# Patient Record
Sex: Female | Born: 1995 | Race: Black or African American | Hispanic: Yes | Marital: Single | Smoking: Current every day smoker
Health system: Southern US, Community
[De-identification: ages and names within clinical notes are randomized; demographics above are authoritative.]

## PROBLEM LIST (undated history)

## (undated) DIAGNOSIS — F419 Anxiety disorder, unspecified: Secondary | ICD-10-CM

## (undated) DIAGNOSIS — F32A Depression, unspecified: Secondary | ICD-10-CM

## (undated) HISTORY — DX: Anxiety disorder, unspecified: F41.9

## (undated) HISTORY — DX: Depression, unspecified: F32.A

## (undated) HISTORY — PX: APPENDECTOMY: SHX54

---
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
## 2015-01-04 ENCOUNTER — Emergency Department (HOSPITAL_COMMUNITY)
Admission: EM | Admit: 2015-01-04 | Discharge: 2015-01-04 | Disposition: A | Discharge status: Home / Self Care | Payer: No Typology Code available for payment source | Attending: Emergency Medicine | Admitting: Emergency Medicine

## 2015-01-04 ENCOUNTER — Encounter (HOSPITAL_COMMUNITY): Payer: Self-pay | Admitting: *Deleted

## 2015-01-04 DIAGNOSIS — Z8619 Personal history of other infectious and parasitic diseases: Secondary | ICD-10-CM | POA: Insufficient documentation

## 2015-01-04 DIAGNOSIS — N898 Other specified noninflammatory disorders of vagina: Secondary | ICD-10-CM | POA: Insufficient documentation

## 2015-01-04 DIAGNOSIS — Z792 Long term (current) use of antibiotics: Secondary | ICD-10-CM | POA: Diagnosis not present

## 2015-01-04 DIAGNOSIS — Z711 Person with feared health complaint in whom no diagnosis is made: Secondary | ICD-10-CM

## 2015-01-04 DIAGNOSIS — Z113 Encounter for screening for infections with a predominantly sexual mode of transmission: Secondary | ICD-10-CM | POA: Insufficient documentation

## 2015-01-04 LAB — POC URINE PREG, ED: Preg Test, Ur: NEGATIVE

## 2015-01-04 LAB — WET PREP, GENITAL
CLUE CELLS WET PREP: NONE SEEN
Trich, Wet Prep: NONE SEEN
WBC, Wet Prep HPF POC: NONE SEEN
YEAST WET PREP: NONE SEEN

## 2015-01-04 LAB — URINALYSIS, ROUTINE W REFLEX MICROSCOPIC
Bilirubin Urine: NEGATIVE
Glucose, UA: NEGATIVE mg/dL
Hgb urine dipstick: NEGATIVE
Ketones, ur: NEGATIVE mg/dL
LEUKOCYTES UA: NEGATIVE
NITRITE: NEGATIVE
PH: 6.5 (ref 5.0–8.0)
PROTEIN: NEGATIVE mg/dL
Specific Gravity, Urine: 1.007 (ref 1.005–1.030)
UROBILINOGEN UA: 0.2 mg/dL (ref 0.0–1.0)

## 2015-01-04 MED ORDER — CEFTRIAXONE SODIUM 250 MG IJ SOLR
250.0000 mg | Freq: Once | INTRAMUSCULAR | Status: AC
  Administered 2015-01-04: 250 mg via INTRAMUSCULAR
  Filled 2015-01-04: qty 250

## 2015-01-04 MED ORDER — LIDOCAINE HCL (PF) 1 % IJ SOLN
2.0000 mL | Freq: Once | INTRAMUSCULAR | Status: AC
  Administered 2015-01-04: 2 mL via INTRADERMAL
  Filled 2015-01-04: qty 5

## 2015-01-04 MED ORDER — LIDOCAINE HCL 2 % IJ SOLN: 5.0000 mL | Freq: Once | INTRAMUSCULAR | Status: DC

## 2015-01-04 MED ORDER — AZITHROMYCIN 250 MG PO TABS
1000.0000 mg | ORAL_TABLET | Freq: Once | ORAL | Status: AC
  Administered 2015-01-04: 1000 mg via ORAL
  Filled 2015-01-04: qty 4

## 2015-01-04 NOTE — Discharge Instructions (Signed)

## 2015-01-04 NOTE — ED Notes (Signed)
Pt states that she has had "uncomfortable urination" and vaginal discharge with a foul odor. Pt states that she has some lower abdominal pain as well.

## 2015-01-04 NOTE — ED Provider Notes (Signed)
CSN: [id number]     Arrival date & time 01/04/15  1310 History   First MD Initiated Contact with Patient 01/04/15 1520     Chief Complaint  Patient presents with  . Vaginal Discharge  . Dysuria   Danielle Arroyo is a 19 y.o. female with a history of an appendectomy who presents to the ED complaining of urine odor, urinary urgency, urinary frequency, white vaginal discharge for the past 4 days. She denies any abdominal pain. Patient is sexually active and not using protection. The patient does have a history of chlamydia. Patient reports taking peridium with minimal relief. Her last menstrual cycle was on 12/16/2014 was normal. Patient's only abdominal surgical history is an appendectomy. The patient denies fevers, chills, nausea, vomiting, diarrhea, abdominal pain, rashes, or hematochezia.   (Consider location/radiation/quality/duration/timing/severity/associated sxs/prior Treatment) HPI  History reviewed. No pertinent past medical history. Past Surgical History  Procedure Laterality Date  . Appendectomy     No family history on file. History  Substance Use Topics  . Smoking status: Never Smoker   . Smokeless tobacco: Not on file  . Alcohol Use: No   OB History    No data available     Review of Systems  Constitutional: Negative for fever and chills.  HENT: Negative for congestion and sore throat.   Eyes: Negative for visual disturbance.  Respiratory: Negative for cough, shortness of breath and wheezing.   Cardiovascular: Negative for chest pain and palpitations.  Gastrointestinal: Negative for nausea, vomiting, abdominal pain, diarrhea and blood in stool.  Genitourinary: Positive for dysuria, urgency, frequency and vaginal discharge. Negative for hematuria, flank pain, vaginal bleeding, difficulty urinating, genital sores and menstrual problem.  Musculoskeletal: Negative for back pain and neck pain.  Skin: Negative for rash.  Neurological: Negative for headaches.       Allergies  Flagyl  Home Medications   Prior to Admission medications   Medication Sig Start Date End Date Taking? Authorizing Provider  cephALEXin (KEFLEX) 500 MG capsule Take 1 capsule (500 mg total) by mouth 2 (two) times daily. 06/30/14   Jamal Collin, MD  medroxyPROGESTERone (DEPO-PROVERA) 150 MG/ML injection Inject 150 mg into the muscle every 3 (three) months.    Historical Provider, MD   BP 129/81 mmHg  Pulse 75  Temp(Src) 98.4 F (36.9 C) (Oral)  Resp 16  SpO2 100%  LMP 12/14/2014 Physical Exam  Constitutional: She appears well-developed and well-nourished. No distress.  HENT:  Head: Normocephalic and atraumatic.  Mouth/Throat: Oropharynx is clear and moist.  Eyes: Conjunctivae are normal. Pupils are equal, round, and reactive to light. Right eye exhibits no discharge. Left eye exhibits no discharge.  Neck: Neck supple.  Cardiovascular: Normal rate, regular rhythm, normal heart sounds and intact distal pulses.  Exam reveals no gallop and no friction rub.   No murmur heard. Pulmonary/Chest: Effort normal and breath sounds normal. No respiratory distress. She has no wheezes. She has no rales.  Abdominal: Soft. Bowel sounds are normal. She exhibits no distension and no mass. There is no tenderness. There is no rebound and no guarding.  Abdomen is soft and nontender to palpation. Bowel sounds are present.  Genitourinary:  Pelvic exam performed by me with female RN chaperone. There are no external lesions or rashes noted. Patient's cervix is closed. There is no cervical motion tenderness. No adnexal tenderness or fullness. There is a moderate amount of white vaginal discharge present. No vaginal bleeding.  Musculoskeletal: She exhibits no edema.  Lymphadenopathy:  She has no cervical adenopathy.  Neurological: She is alert. Coordination normal.  Skin: Skin is warm and dry. No rash noted. She is not diaphoretic. No erythema. No pallor.  Psychiatric: She has a normal  mood and affect. Her behavior is normal.  Nursing note and vitals reviewed.   ED Course  Procedures (including critical care time) Labs Review Labs Reviewed  WET PREP, GENITAL  URINALYSIS, ROUTINE W REFLEX MICROSCOPIC  HIV ANTIBODY (ROUTINE TESTING)  RPR  POC URINE PREG, ED  GC/CHLAMYDIA PROBE AMP ([place])    Imaging Review No results found.   EKG Interpretation None     Filed Vitals:   01/04/15 1540 01/04/15 1541 01/04/15 1639 01/04/15 1715  BP: 124/79  138/82 129/81  Pulse:  74 76 75  Temp:   98.4 F (36.9 C)   TempSrc:   Oral   Resp:   16   SpO2:  100% 100% 100%    MDM   Meds given in ED:  Medications  cefTRIAXone (ROCEPHIN) injection 250 mg (250 mg Intramuscular Given 01/04/15 1720)  azithromycin (ZITHROMAX) tablet 1,000 mg (1,000 mg Oral Given 01/04/15 1718)  lidocaine (PF) (XYLOCAINE) 1 % injection 2 mL (2 mLs Intradermal Given 01/04/15 1720)    New Prescriptions   No medications on file    Final diagnoses:  Concern about STD in female without diagnosis  Vaginal discharge   This 19 year old female who presents to the emergency room complaining of urinary odor, and vaginal discharge decision with urinary urgency for the past 4 days. Patient is sexually active without using protection. She denies any abdominal pain. The patient is afebrile and nontoxic appearing. The patient's LV exam indicated a moderate amount of white vaginal discharge without cervical motion tenderness or adnexal tenderness or fullness. The patient's urinalysis is negative for infection. She has a negative urine pregnancy test. Her wet prep returned unremarkable. Will treat for gonorrhea and chlamydia with azithromycin and Rocephin in the emergency department. I advised her to follow-up with her primary care provider or the women's outpatient clinic. I educated on safe sex practices. I advised the patient to follow-up with their primary care provider this week. I advised the patient to  return to the emergency department with new or worsening symptoms or new concerns. The patient verbalized understanding and agreement with plan.      Everlene FarrierWilliam Jimmey Hengel, PA-C 01/04/15 1739  Bethann BerkshireJoseph Zammit, MD 01/05/15 1539

## 2015-01-05 LAB — GC/CHLAMYDIA PROBE AMP (~~LOC~~) NOT AT ARMC
Chlamydia: NEGATIVE
Neisseria Gonorrhea: NEGATIVE

## 2015-01-05 LAB — HIV ANTIBODY (ROUTINE TESTING W REFLEX): HIV Screen 4th Generation wRfx: NONREACTIVE

## 2015-01-05 LAB — RPR: RPR Ser Ql: NONREACTIVE

## 2016-10-16 ENCOUNTER — Emergency Department (HOSPITAL_COMMUNITY)
Admission: EM | Admit: 2016-10-16 | Discharge: 2016-10-16 | Disposition: A | Discharge status: Home / Self Care | Payer: Self-pay | Attending: Emergency Medicine | Admitting: Emergency Medicine

## 2016-10-16 ENCOUNTER — Encounter (HOSPITAL_COMMUNITY): Payer: Self-pay | Admitting: *Deleted

## 2016-10-16 ENCOUNTER — Emergency Department (HOSPITAL_COMMUNITY): Payer: Self-pay

## 2016-10-16 DIAGNOSIS — R6889 Other general symptoms and signs: Secondary | ICD-10-CM

## 2016-10-16 DIAGNOSIS — J029 Acute pharyngitis, unspecified: Secondary | ICD-10-CM | POA: Insufficient documentation

## 2016-10-16 LAB — URINALYSIS, ROUTINE W REFLEX MICROSCOPIC
BACTERIA UA: NONE SEEN
Bilirubin Urine: NEGATIVE
Glucose, UA: NEGATIVE mg/dL
Hgb urine dipstick: NEGATIVE
Ketones, ur: 5 mg/dL — AB
Leukocytes, UA: NEGATIVE
NITRITE: NEGATIVE
Protein, ur: 30 mg/dL — AB
SPECIFIC GRAVITY, URINE: 1.027 (ref 1.005–1.030)
pH: 7 (ref 5.0–8.0)

## 2016-10-16 LAB — POC URINE PREG, ED: PREG TEST UR: NEGATIVE

## 2016-10-16 MED ORDER — OSELTAMIVIR PHOSPHATE 75 MG PO CAPS: 75.0000 mg | ORAL_CAPSULE | Freq: Two times a day (BID) | ORAL | 0 refills | Status: DC

## 2016-10-16 MED ORDER — OSELTAMIVIR PHOSPHATE 75 MG PO CAPS
75.0000 mg | ORAL_CAPSULE | Freq: Once | ORAL | Status: AC
  Administered 2016-10-16: 75 mg via ORAL
  Filled 2016-10-16: qty 1

## 2016-10-16 MED ORDER — FLUTICASONE PROPIONATE 50 MCG/ACT NA SUSP: 2.0000 | Freq: Every day | NASAL | 0 refills | Status: DC

## 2016-10-16 MED ORDER — BENZONATATE 100 MG PO CAPS: 100.0000 mg | ORAL_CAPSULE | Freq: Three times a day (TID) | ORAL | 0 refills | Status: DC

## 2016-10-16 MED ORDER — IBUPROFEN 600 MG PO TABS: 600.0000 mg | ORAL_TABLET | Freq: Four times a day (QID) | ORAL | 0 refills | Status: DC | PRN

## 2016-10-16 MED ORDER — IBUPROFEN 400 MG PO TABS
600.0000 mg | ORAL_TABLET | Freq: Once | ORAL | Status: AC
  Administered 2016-10-16: 600 mg via ORAL
  Filled 2016-10-16: qty 1

## 2016-10-16 NOTE — ED Notes (Signed)
Patient transported to X-ray 

## 2016-10-16 NOTE — ED Notes (Signed)
Patient updated on wait no additional complaints.

## 2016-10-16 NOTE — ED Provider Notes (Signed)
[facility]-EMERGENCY DEPT Provider Note   CSN: [id number] Arrival date & time: 10/16/16  0346     History   Chief Complaint Chief Complaint  Patient presents with  . Back Pain  . Chills    HPI Danielle Arroyo is a 21 y.o. female.  HPI Patient presents with 2 days of subjective fevers and chills. She's also had sweats and diffuse body aches. She complains of nasal congestion and pressure in bilateral ears. She's had a cough productive of yellow sputum. Denies any abdominal pain, nausea or vomiting. No diarrhea or urinary symptoms. No chest pain or shortness of breath. History reviewed. No pertinent past medical history.  There are no active problems to display for this patient.   Past Surgical History:  Procedure Laterality Date  . APPENDECTOMY      OB History    No data available       Home Medications    Prior to Admission medications   Medication Sig Start Date End Date Taking? Authorizing Provider  benzonatate (TESSALON) 100 MG capsule Take 1 capsule (100 mg total) by mouth every 8 (eight) hours. 10/16/16   Loren Racer, MD  cephALEXin (KEFLEX) 500 MG capsule Take 1 capsule (500 mg total) by mouth 2 (two) times daily. 06/30/14   Jamal Collin, MD  fluticasone (FLONASE) 50 MCG/ACT nasal spray Place 2 sprays into both nostrils daily. 10/16/16   Loren Racer, MD  ibuprofen (ADVIL,MOTRIN) 600 MG tablet Take 1 tablet (600 mg total) by mouth every 6 (six) hours as needed. 10/16/16   Loren Racer, MD  medroxyPROGESTERone (DEPO-PROVERA) 150 MG/ML injection Inject 150 mg into the muscle every 3 (three) months.    Historical Provider, MD  oseltamivir (TAMIFLU) 75 MG capsule Take 1 capsule (75 mg total) by mouth 2 (two) times daily. 10/16/16   Loren Racer, MD    Family History History reviewed. No pertinent family history.  Social History Social History  Substance Use Topics  . Smoking status: Never Smoker  . Smokeless tobacco: Never Used  . Alcohol use No      Allergies   Flagyl [metronidazole]   Review of Systems Review of Systems  Constitutional: Positive for chills, diaphoresis, fatigue and fever.  HENT: Positive for congestion, ear pain, rhinorrhea and sore throat. Negative for facial swelling.   Respiratory: Positive for cough. Negative for shortness of breath and wheezing.   Cardiovascular: Negative for chest pain and leg swelling.  Gastrointestinal: Negative for abdominal pain, constipation, diarrhea, nausea and vomiting.  Genitourinary: Negative for dysuria, flank pain, frequency, hematuria, pelvic pain, vaginal bleeding and vaginal discharge.  Musculoskeletal: Positive for myalgias. Negative for arthralgias, back pain, neck pain and neck stiffness.  Neurological: Positive for headaches. Negative for dizziness, weakness, light-headedness and numbness.  All other systems reviewed and are negative.    Physical Exam Updated Vital Signs BP 119/69   Pulse 86   Temp 99.1 F (37.3 C) (Oral)   Resp 16   Ht 5\' 3"  (1.6 m)   Wt 197 lb 7 oz (89.6 kg)   LMP 10/03/2016   SpO2 100%   BMI 34.97 kg/m   Physical Exam  Constitutional: She is oriented to person, place, and time. She appears well-developed and well-nourished. No distress.  HENT:  Head: Normocephalic and atraumatic.  Mouth/Throat: Oropharynx is clear and moist. No oropharyngeal exudate.  Bilateral nasal mucosal edema. Oropharynx is mildly erythematous. Mildly erythematous bilateral TMs. No definite sinus tenderness to percussion.  Eyes: EOM are normal. Pupils are equal, round,  and reactive to light.  Neck: Normal range of motion. Neck supple.  No meningismus. No cervical adenopathy.  Cardiovascular: Normal rate and regular rhythm.  Exam reveals no gallop and no friction rub.   No murmur heard. Pulmonary/Chest: Effort normal and breath sounds normal. No respiratory distress. She has no wheezes. She has no rales. She exhibits no tenderness.  Abdominal: Soft. Bowel  sounds are normal. There is no tenderness. There is no rebound and no guarding.  Musculoskeletal: Normal range of motion. She exhibits no edema or tenderness.  No CVA tenderness bilaterally. No lower extremity swelling, asymmetry or tenderness. No midline thoracic or lumbar tenderness.  Neurological: She is alert and oriented to person, place, and time.  5/5 motor in all extremity. Sensation is fully intact.  Skin: Skin is warm and dry. Capillary refill takes less than 2 seconds. No rash noted. No erythema.  Psychiatric: She has a normal mood and affect. Her behavior is normal.  Nursing note and vitals reviewed.    ED Treatments / Results  Labs (all labs ordered are listed, but only abnormal results are displayed) Labs Reviewed  URINALYSIS, ROUTINE W REFLEX MICROSCOPIC - Abnormal; Notable for the following:       Result Value   APPearance HAZY (*)    Ketones, ur 5 (*)    Protein, ur 30 (*)    Squamous Epithelial / LPF 0-5 (*)    All other components within normal limits  POC URINE PREG, ED    EKG  EKG Interpretation None       Radiology No results found.  Procedures Procedures (including critical care time)  Medications Ordered in ED Medications  ibuprofen (ADVIL,MOTRIN) tablet 600 mg (600 mg Oral Given 10/16/16 1016)  oseltamivir (TAMIFLU) capsule 75 mg (75 mg Oral Given 10/16/16 1016)     Initial Impression / Assessment and Plan / ED Course  I have reviewed the triage vital signs and the nursing notes.  Pertinent labs & imaging results that were available during my care of the patient were reviewed by me and considered in my medical decision making (see chart for details).     Patient well-appearing. Presents with flu-like symptoms. Return precautions given. Final Clinical Impressions(s) / ED Diagnoses   Final diagnoses:  Flu-like symptoms    New Prescriptions Discharge Medication List as of 10/16/2016  9:57 AM    START taking these medications   Details   benzonatate (TESSALON) 100 MG capsule Take 1 capsule (100 mg total) by mouth every 8 (eight) hours., Starting Wed 10/16/2016, Print    fluticasone (FLONASE) 50 MCG/ACT nasal spray Place 2 sprays into both nostrils daily., Starting Wed 10/16/2016, Print    ibuprofen (ADVIL,MOTRIN) 600 MG tablet Take 1 tablet (600 mg total) by mouth every 6 (six) hours as needed., Starting Wed 10/16/2016, Print    oseltamivir (TAMIFLU) 75 MG capsule Take 1 capsule (75 mg total) by mouth 2 (two) times daily., Starting Wed 10/16/2016, Print         Loren Raceravid Syd Manges, MD 10/26/16 1113

## 2016-10-16 NOTE — ED Triage Notes (Signed)
C/o generalized body aches onset 3 days ago

## 2019-01-04 ENCOUNTER — Emergency Department (HOSPITAL_COMMUNITY)
Admission: EM | Admit: 2019-01-04 | Discharge: 2019-01-04 | Disposition: A | Discharge status: Home / Self Care | Payer: Self-pay | Attending: Emergency Medicine | Admitting: Emergency Medicine

## 2019-01-04 ENCOUNTER — Encounter (HOSPITAL_COMMUNITY): Payer: Self-pay

## 2019-01-04 ENCOUNTER — Other Ambulatory Visit: Payer: Self-pay

## 2019-01-04 DIAGNOSIS — S161XXA Strain of muscle, fascia and tendon at neck level, initial encounter: Secondary | ICD-10-CM | POA: Insufficient documentation

## 2019-01-04 DIAGNOSIS — Y92410 Unspecified street and highway as the place of occurrence of the external cause: Secondary | ICD-10-CM | POA: Insufficient documentation

## 2019-01-04 DIAGNOSIS — Y998 Other external cause status: Secondary | ICD-10-CM | POA: Insufficient documentation

## 2019-01-04 DIAGNOSIS — Y939 Activity, unspecified: Secondary | ICD-10-CM | POA: Insufficient documentation

## 2019-01-04 DIAGNOSIS — Z79899 Other long term (current) drug therapy: Secondary | ICD-10-CM | POA: Insufficient documentation

## 2019-01-04 MED ORDER — METHOCARBAMOL 500 MG PO TABS: 500.0000 mg | ORAL_TABLET | Freq: Two times a day (BID) | ORAL | 0 refills | Status: DC
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
## 2019-01-04 NOTE — Discharge Instructions (Addendum)
Please read attached information. If you experience any new or worsening signs or symptoms please return to the emergency room for evaluation. Please follow-up with your primary care provider or specialist as discussed. Please use medication prescribed only as directed and discontinue taking if you have any concerning signs or symptoms.   °

## 2019-09-19 ENCOUNTER — Emergency Department (HOSPITAL_COMMUNITY)
Admission: EM | Admit: 2019-09-19 | Discharge: 2019-09-19 | Disposition: A | Discharge status: Home / Self Care | Payer: Self-pay | Attending: Emergency Medicine | Admitting: Emergency Medicine

## 2019-09-19 ENCOUNTER — Encounter (HOSPITAL_COMMUNITY): Payer: Self-pay | Admitting: *Deleted

## 2019-09-19 ENCOUNTER — Other Ambulatory Visit: Payer: Self-pay

## 2019-09-19 DIAGNOSIS — Z793 Long term (current) use of hormonal contraceptives: Secondary | ICD-10-CM | POA: Insufficient documentation

## 2019-09-19 DIAGNOSIS — Z79899 Other long term (current) drug therapy: Secondary | ICD-10-CM | POA: Insufficient documentation

## 2019-09-19 DIAGNOSIS — J019 Acute sinusitis, unspecified: Secondary | ICD-10-CM | POA: Insufficient documentation

## 2019-09-19 DIAGNOSIS — Z20828 Contact with and (suspected) exposure to other viral communicable diseases: Secondary | ICD-10-CM | POA: Insufficient documentation

## 2019-09-19 NOTE — ED Provider Notes (Signed)
[facility]   CSN: [id number] Arrival date & time: 09/19/19  2112     History Chief Complaint  Patient presents with  . cold  headache    Bubba Campbony Munshi is a 23 y.o. female.   The history is provided by the patient. No language interpreter was used.  URI Presenting symptoms: congestion, cough, facial pain and rhinorrhea   Presenting symptoms: no fever and no sore throat   Congestion:    Location:  Nasal   Interferes with sleep: no     Interferes with eating/drinking: no   Cough:    Cough characteristics:  Non-productive   Severity:  Moderate   Onset quality:  Gradual   Duration:  3 days   Timing:  Sporadic   Progression:  Unchanged   Chronicity:  New Severity:  Moderate Onset quality:  Gradual Duration:  3 days Timing:  Constant Progression:  Unchanged Chronicity:  New Relieved by: Tylenol. Associated symptoms: sinus pain   Risk factors: no chronic respiratory disease   Risk factors comment:  +COVID exposure at work; however tested negative on 09/09/19 after this exposure. No other known sick contacts.      History reviewed. No pertinent past medical history.  There are no problems to display for this patient.   Past Surgical History:  Procedure Laterality Date  . APPENDECTOMY       OB History   No obstetric history on file.     No family history on file.  Social History   Tobacco Use  . Smoking status: Never Smoker  . Smokeless tobacco: Never Used  Substance Use Topics  . Alcohol use: No  . Drug use: No    Home Medications Prior to Admission medications   Medication Sig Start Date End Date Taking? Authorizing Provider  benzonatate (TESSALON) 100 MG capsule Take 1 capsule (100 mg total) by mouth every 8 (eight) hours. 10/16/16   Loren RacerYelverton, David, MD  cephALEXin (KEFLEX) 500 MG capsule Take 1 capsule (500 mg total) by mouth 2 (two) times daily. 06/30/14   Jamal CollinJoyner, James R, MD  fluticasone  (FLONASE) 50 MCG/ACT nasal spray Place 2 sprays into both nostrils daily. 10/16/16   Loren RacerYelverton, David, MD  ibuprofen (ADVIL,MOTRIN) 600 MG tablet Take 1 tablet (600 mg total) by mouth every 6 (six) hours as needed. 10/16/16   Loren RacerYelverton, David, MD  medroxyPROGESTERone (DEPO-PROVERA) 150 MG/ML injection Inject 150 mg into the muscle every 3 (three) months.    [provider]  methocarbamol (ROBAXIN) 500 MG tablet Take 1 tablet (500 mg total) by mouth 2 (two) times daily. 01/04/19   Hedges, Tinnie GensJeffrey, PA-C  oseltamivir (TAMIFLU) 75 MG capsule Take 1 capsule (75 mg total) by mouth 2 (two) times daily. 10/16/16   Loren RacerYelverton, David, MD    Allergies    Flagyl [metronidazole]  Review of Systems   Review of Systems  Constitutional: Negative for fever.  HENT: Positive for congestion, rhinorrhea and sinus pain. Negative for sore throat.   Respiratory: Positive for cough.   Ten systems reviewed and are negative for acute change, except as noted in the HPI.    Physical Exam Updated Vital Signs BP (!) 147/109 (BP Location: Right Arm)   Pulse (!) 57   Temp 98.5 F (36.9 C) (Oral)   Resp 16   Ht 5\' 2"  (1.575 m)   Wt 88.5 kg   LMP 09/11/2019   SpO2 99%   BMI 35.67 kg/m   Physical Exam Vitals  and nursing note reviewed.  Constitutional:      General: She is not in acute distress.    Appearance: She is well-developed. She is not diaphoretic.     Comments: Nontoxic appearing and in NAD  HENT:     Head: Normocephalic and atraumatic.     Nose: Congestion present. No rhinorrhea.     Right Sinus: No maxillary sinus tenderness or frontal sinus tenderness.     Left Sinus: No maxillary sinus tenderness or frontal sinus tenderness.     Mouth/Throat:     Comments: Oropharynx clear. Tolerating secretions. Normal phonation. Eyes:     General: No scleral icterus.    Conjunctiva/sclera: Conjunctivae normal.     Pupils: Pupils are equal, round, and reactive to light.  Cardiovascular:     Rate and  Rhythm: Normal rate and regular rhythm.     Pulses: Normal pulses.  Pulmonary:     Effort: Pulmonary effort is normal. No respiratory distress.     Breath sounds: No stridor. No wheezing or rales.     Comments: Lungs CTAB. Respirations even and unlabored. Musculoskeletal:        General: Normal range of motion.     Cervical back: Normal range of motion.  Skin:    General: Skin is warm and dry.     Coloration: Skin is not pale.     Findings: No erythema or rash.  Neurological:     General: No focal deficit present.     Mental Status: She is alert and oriented to person, place, and time.     Coordination: Coordination normal.  Psychiatric:        Behavior: Behavior normal.     ED Results / Procedures / Treatments   Labs (all labs ordered are listed, but only abnormal results are displayed) Labs Reviewed  NOVEL CORONAVIRUS, NAA (HOSP ORDER, SEND-OUT TO REF LAB; TAT 18-24 HRS)    EKG None  Radiology No results found.  Procedures Procedures (including critical care time)  Medications Ordered in ED Medications - No data to display  ED Course  I have reviewed the triage vital signs and the nursing notes.  Pertinent labs & imaging results that were available during my care of the patient were reviewed by me and considered in my medical decision making (see chart for details).    MDM Rules/Calculators/A&P   Patient complaining of symptoms of sinusitis.  Mild to moderate symptoms of clear/yellow nasal discharge/congestion, sinus pressure, with cough for less than 10 days.  Patient is afebrile.  No concern for acute bacterial rhinosinusitis; likely viral in nature.  Patient discharged with symptomatic treatment.  Patient instructions given for warm saline nasal washes.  Recommendations for follow-up with primary care physician.    Lanyla Costello was evaluated in Emergency Department on 09/19/2019 for the symptoms described in the history of present illness. She was evaluated in  the context of the global COVID-19 pandemic, which necessitated consideration that the patient might be at risk for infection with the SARS-CoV-2 virus that causes COVID-19. Institutional protocols and algorithms that pertain to the evaluation of patients at risk for COVID-19 are in a state of rapid change based on information released by regulatory bodies including the CDC and federal and state organizations. These policies and algorithms were followed during the patient's care in the ED.   Final Clinical Impression(s) / ED Diagnoses Final diagnoses:  Acute non-recurrent sinusitis, unspecified location    Rx / DC Orders ED Discharge Orders  None       Antonietta Breach, PA-C 09/19/19 2321    Tegeler, Gwenyth Allegra, MD 09/20/19 [phone]

## 2019-09-19 NOTE — ED Triage Notes (Signed)
The pt has hads a cold cough headache dry mucous membranes with congestion for 3 days no temp  Had covid swab on the 3rd that was negative

## 2019-09-19 NOTE — Discharge Instructions (Signed)
We recommend Tylenol if you develop fever.  Use ibuprofen for management of any additional facial pain or body aches.  You may benefit from Mucinex for cough as well as daily Zyrtec or Claritin for management with congestion.  Congestion may also be improved with the use of saline sinus rinses.  You have a Covid test pending.  Results should return within 48 hours.  If you test positive for coronavirus, you will need to quarantine for a total of 14 days or until your symptoms have resolved for a total of 72 hours; which ever is longer.  Otherwise, continue follow-up with your primary doctor.  Return for any new or concerning symptoms.

## 2019-09-21 LAB — NOVEL CORONAVIRUS, NAA (HOSP ORDER, SEND-OUT TO REF LAB; TAT 18-24 HRS): SARS-CoV-2, NAA: NOT DETECTED

## 2019-09-23 ENCOUNTER — Telehealth (HOSPITAL_COMMUNITY): Payer: Self-pay

## 2019-11-03 ENCOUNTER — Ambulatory Visit (HOSPITAL_COMMUNITY)
Admission: EM | Admit: 2019-11-03 | Discharge: 2019-11-03 | Disposition: A | Discharge status: Home / Self Care | Payer: Self-pay | Attending: Family Medicine | Admitting: Family Medicine

## 2019-11-03 ENCOUNTER — Ambulatory Visit (INDEPENDENT_AMBULATORY_CARE_PROVIDER_SITE_OTHER): Payer: Self-pay

## 2019-11-03 ENCOUNTER — Other Ambulatory Visit: Payer: Self-pay

## 2019-11-03 ENCOUNTER — Encounter (HOSPITAL_COMMUNITY): Payer: Self-pay

## 2019-11-03 DIAGNOSIS — Z793 Long term (current) use of hormonal contraceptives: Secondary | ICD-10-CM | POA: Insufficient documentation

## 2019-11-03 DIAGNOSIS — K5900 Constipation, unspecified: Secondary | ICD-10-CM

## 2019-11-03 DIAGNOSIS — Z20822 Contact with and (suspected) exposure to covid-19: Secondary | ICD-10-CM | POA: Insufficient documentation

## 2019-11-03 DIAGNOSIS — Z881 Allergy status to other antibiotic agents status: Secondary | ICD-10-CM | POA: Insufficient documentation

## 2019-11-03 DIAGNOSIS — K529 Noninfective gastroenteritis and colitis, unspecified: Secondary | ICD-10-CM | POA: Insufficient documentation

## 2019-11-03 LAB — COMPREHENSIVE METABOLIC PANEL
ALT: 18 U/L (ref 0–44)
AST: 20 U/L (ref 15–41)
Albumin: 3.6 g/dL (ref 3.5–5.0)
Alkaline Phosphatase: 61 U/L (ref 38–126)
Anion gap: 8 (ref 5–15)
BUN: 12 mg/dL (ref 6–20)
CO2: 26 mmol/L (ref 22–32)
Calcium: 9.1 mg/dL (ref 8.9–10.3)
Chloride: 104 mmol/L (ref 98–111)
Creatinine, Ser: 0.49 mg/dL (ref 0.44–1.00)
GFR calc Af Amer: >60 mL/min (ref >60)
GFR calc non Af Amer: >60 mL/min (ref >60)
Glucose, Bld: 88 mg/dL (ref 70–99)
Potassium: 4.2 mmol/L (ref 3.5–5.1)
Sodium: 138 mmol/L (ref 135–145)
Total Bilirubin: 0.4 mg/dL (ref 0.3–1.2)
Total Protein: 6.9 g/dL (ref 6.5–8.1)

## 2019-11-03 LAB — CBC
HCT: 39.9 % (ref 36.0–46.0)
Hemoglobin: 13.1 g/dL (ref 12.0–15.0)
MCH: 30.8 pg (ref 26.0–34.0)
MCHC: 32.8 g/dL (ref 30.0–36.0)
MCV: 93.9 fL (ref 80.0–100.0)
Platelets: 408 10*3/uL — ABNORMAL HIGH (ref 150–400)
RBC: 4.25 MIL/uL (ref 3.87–5.11)
RDW: 12.8 % (ref 11.5–15.5)
WBC: 5.3 10*3/uL (ref 4.0–10.5)
nRBC: 0 % (ref 0.0–0.2)

## 2019-11-03 LAB — LIPASE, BLOOD: Lipase: 22 U/L (ref 11–51)

## 2019-11-03 LAB — AMYLASE: Amylase: 83 U/L (ref 28–100)

## 2019-11-03 NOTE — Discharge Instructions (Addendum)
Your x ray showed what is most likely enteritis or inflammation of the bowels from something you ate.  He also have a moderate amount of stool. We will have you do MiraLAX daily and drink fluids with bowel rest If you start developing any fevers or worsening symptoms you need to go to the ER for CT scan.  I

## 2019-11-03 NOTE — ED Provider Notes (Signed)
[facility]    CSN: [id number] Arrival date & time: 11/03/19  1153      History   Chief Complaint Chief Complaint  Patient presents with  . Diarrhea    HPI Danielle Arroyo is a 24 y.o. female.   Patient is a 24 year old female no significant past medical history.  She presents today with multiple episodes of watery diarrhea, upper abdominal discomfort,, nausea and loss of appetite.  Symptoms have been constant, waxing waning x5 days.  Started after eating which she believes to be some bad fast food.  Her boyfriend had similar symptoms after eating the food but his have resolved.  No vomiting or blood in stools.  She has been using Pepto tablets with some relief.  Describes the pain as sharp and achy.  No recent solid stools.  No fever, chills, body aches, night sweats. Patient's last menstrual period was 11/03/2019. No recent travels or abx use.   ROS per HPI    Diarrhea   History reviewed. No pertinent past medical history.  There are no problems to display for this patient.   Past Surgical History:  Procedure Laterality Date  . APPENDECTOMY      OB History   No obstetric history on file.      Home Medications    Prior to Admission medications   Medication Sig Start Date End Date Taking? Authorizing Provider  medroxyPROGESTERone (DEPO-PROVERA) 150 MG/ML injection Inject 150 mg into the muscle every 3 (three) months.    [provider]  fluticasone (FLONASE) 50 MCG/ACT nasal spray Place 2 sprays into both nostrils daily. 10/16/16 11/03/19  Loren Racer, MD    Family History History reviewed. No pertinent family history.  Social History Social History   Tobacco Use  . Smoking status: Never Smoker  . Smokeless tobacco: Never Used  Substance Use Topics  . Alcohol use: No  . Drug use: No     Allergies   Flagyl [metronidazole]   Review of Systems Review of Systems  Gastrointestinal: Positive for diarrhea.     Physical  Exam Triage Vital Signs ED Triage Vitals  Enc Vitals Group     BP 11/03/19 1235 112/80     Pulse Rate 11/03/19 1235 66     Resp 11/03/19 1235 18     Temp 11/03/19 1235 98.5 F (36.9 C)     Temp Source 11/03/19 1235 Oral     SpO2 11/03/19 1235 98 %     Weight 11/03/19 1233 208 lb (94.3 kg)     Height --      Head Circumference --      Peak Flow --      Pain Score 11/03/19 1233 4     Pain Loc --      Pain Edu? --      Excl. in GC? --    No data found.  Updated Vital Signs BP 112/80 (BP Location: Right Arm)   Pulse 66   Temp 98.5 F (36.9 C) (Oral)   Resp 18   Wt 208 lb (94.3 kg)   LMP 11/03/2019   SpO2 98%   BMI 38.04 kg/m   Visual Acuity Right Eye Distance:   Left Eye Distance:   Bilateral Distance:    Right Eye Near:   Left Eye Near:    Bilateral Near:     Physical Exam Vitals and nursing note reviewed.  Constitutional:      General: She is not in acute distress.  Appearance: Normal appearance. She is not ill-appearing, toxic-appearing or diaphoretic.  HENT:     Head: Normocephalic.     Nose: Nose normal.  Eyes:     Conjunctiva/sclera: Conjunctivae normal.  Pulmonary:     Effort: Pulmonary effort is normal.  Abdominal:     General: Bowel sounds are normal.     Palpations: Abdomen is soft.     Tenderness: There is no abdominal tenderness.       Comments: TTP without guarding or rebound.   Musculoskeletal:        General: Normal range of motion.     Cervical back: Normal range of motion.  Skin:    General: Skin is warm and dry.     Findings: No rash.  Neurological:     Mental Status: She is alert.  Psychiatric:        Mood and Affect: Mood normal.      UC Treatments / Results  Labs (all labs ordered are listed, but only abnormal results are displayed) Labs Reviewed  CBC - Abnormal; Notable for the following components:      Result Value   Platelets 408 (*)    All other components within normal limits  NOVEL CORONAVIRUS, NAA (HOSP  ORDER, SEND-OUT TO REF LAB; TAT 18-24 HRS)  COMPREHENSIVE METABOLIC PANEL  LIPASE, BLOOD  AMYLASE    EKG   Radiology DG Abd 1 View  Result Date: 11/03/2019 CLINICAL DATA:  Abdo oral pain and diarrhea EXAM: ABDOMEN - 1 VIEW COMPARISON:  None. FINDINGS: There is fairly diffuse stool throughout the colon. There is a paucity of small bowel gas. There is no bowel dilatation or air-fluid level. No free air. Visualized lung bases clear. No abnormal calcifications. IMPRESSION: Fairly diffuse stool throughout colon. Paucity of small bowel gas noted. This finding potentially may indicate a degree of early ileus or enteritis. There does not appear to be bowel obstruction. No free air. Visualized lung bases clear. Electronically Signed   By: Lowella Grip III M.D.   On: 11/03/2019 13:44    Procedures Procedures (including critical care time)  Medications Ordered in UC Medications - No data to display  Initial Impression / Assessment and Plan / UC Course  I have reviewed the triage vital signs and the nursing notes.  Pertinent labs & imaging results that were available during my care of the patient were reviewed by me and considered in my medical decision making (see chart for details).     Gastroenteritis- most likely dx based on symptoms, exam and results.  X ray  Fairly diffuse stool throughout colon. Paucity of small bowel gas noted. This finding potentially may indicate a degree of early ileus or enteritis. There does not appear to be bowel obstruction. No free air. Visualized lung bases clear. Blood work normal. VSS and she is non toxic or ill appearing  miralax for constipation and bowel rest Push fluids.  ER precautions given  Final Clinical Impressions(s) / UC Diagnoses   Final diagnoses:  Gastroenteritis  Constipation, unspecified constipation type     Discharge Instructions     Your x ray showed what is most likely enteritis or inflammation of the bowels from  something you ate.  He also have a moderate amount of stool. We will have you do MiraLAX daily and drink fluids with bowel rest If you start developing any fevers or worsening symptoms you need to go to the ER for CT scan.  I     ED Prescriptions  None     PDMP not reviewed this encounter.   Janace Aris, NP 11/03/19 1507

## 2019-11-03 NOTE — ED Triage Notes (Signed)
Pt state she has had diarrhea sense Last Saturday night.pt states it has been very watery. Pt states she has lost her appetite.

## 2019-11-05 LAB — NOVEL CORONAVIRUS, NAA (HOSP ORDER, SEND-OUT TO REF LAB; TAT 18-24 HRS): SARS-CoV-2, NAA: NOT DETECTED

## 2019-11-11 ENCOUNTER — Telehealth (HOSPITAL_COMMUNITY): Payer: Self-pay | Admitting: Emergency Medicine

## 2019-11-11 ENCOUNTER — Other Ambulatory Visit: Payer: Self-pay

## 2019-11-11 ENCOUNTER — Ambulatory Visit (HOSPITAL_COMMUNITY)
Admission: EM | Admit: 2019-11-11 | Discharge: 2019-11-11 | Disposition: A | Discharge status: Home / Self Care | Payer: Self-pay

## 2019-11-11 NOTE — Telephone Encounter (Signed)
Patient requesting updated work note.  Gloris Manchester, NP prepared a work note and this nurse gave note to patient.  Patient read note and is agreeable to contact.

## 2020-12-06 ENCOUNTER — Encounter (HOSPITAL_COMMUNITY): Payer: Self-pay

## 2020-12-06 ENCOUNTER — Ambulatory Visit (HOSPITAL_COMMUNITY)
Admission: EM | Admit: 2020-12-06 | Discharge: 2020-12-06 | Disposition: A | Discharge status: Home / Self Care | Payer: Self-pay | Attending: Family Medicine | Admitting: Family Medicine

## 2020-12-06 ENCOUNTER — Other Ambulatory Visit: Payer: Self-pay

## 2020-12-06 ENCOUNTER — Emergency Department (HOSPITAL_COMMUNITY)
Admission: EM | Admit: 2020-12-06 | Discharge: 2020-12-08 | Discharge status: Against Medical Advice | Payer: Self-pay | Attending: Emergency Medicine | Admitting: Emergency Medicine

## 2020-12-06 DIAGNOSIS — R1013 Epigastric pain: Secondary | ICD-10-CM | POA: Insufficient documentation

## 2020-12-06 DIAGNOSIS — M542 Cervicalgia: Secondary | ICD-10-CM | POA: Insufficient documentation

## 2020-12-06 DIAGNOSIS — R519 Headache, unspecified: Secondary | ICD-10-CM

## 2020-12-06 DIAGNOSIS — R112 Nausea with vomiting, unspecified: Secondary | ICD-10-CM

## 2020-12-06 DIAGNOSIS — G43809 Other migraine, not intractable, without status migrainosus: Secondary | ICD-10-CM | POA: Insufficient documentation

## 2020-12-06 LAB — CBC
HCT: 40.3 % (ref 36.0–46.0)
Hemoglobin: 13.9 g/dL (ref 12.0–15.0)
MCH: 32 pg (ref 26.0–34.0)
MCHC: 34.5 g/dL (ref 30.0–36.0)
MCV: 92.6 fL (ref 80.0–100.0)
Platelets: 480 10*3/uL — ABNORMAL HIGH (ref 150–400)
RBC: 4.35 MIL/uL (ref 3.87–5.11)
RDW: 12.6 % (ref 11.5–15.5)
WBC: 9.2 10*3/uL (ref 4.0–10.5)
nRBC: 0 % (ref 0.0–0.2)

## 2020-12-06 LAB — BASIC METABOLIC PANEL
Anion gap: 9 (ref 5–15)
BUN: 11 mg/dL (ref 6–20)
CO2: 25 mmol/L (ref 22–32)
Calcium: 9.2 mg/dL (ref 8.9–10.3)
Chloride: 103 mmol/L (ref 98–111)
Creatinine, Ser: 0.71 mg/dL (ref 0.44–1.00)
GFR, Estimated: >60 mL/min (ref >60)
Glucose, Bld: 87 mg/dL (ref 70–99)
Potassium: 3.9 mmol/L (ref 3.5–5.1)
Sodium: 137 mmol/L (ref 135–145)

## 2020-12-06 MED ORDER — ONDANSETRON 4 MG PO TBDP
4.0000 mg | ORAL_TABLET | Freq: Once | ORAL | Status: AC
  Administered 2020-12-06: 4 mg via ORAL

## 2020-12-06 MED ORDER — ONDANSETRON 4 MG PO TBDP
ORAL_TABLET | ORAL | Status: AC
  Filled 2020-12-06: qty 1

## 2020-12-06 NOTE — ED Notes (Signed)
Patient is being discharged from the Urgent Care and sent to the Emergency Department via pov. Per Mardella Layman, MD, patient is in need of higher level of care due to neck pain, N/V, R/O meningitis. Patient is aware and verbalizes understanding of plan of care.  Vitals:   12/06/20 1829  BP: 124/72  Pulse: 90  Resp: 18  Temp: 98.7 F (37.1 C)  SpO2: 98%

## 2020-12-06 NOTE — ED Triage Notes (Signed)
Pt in with c/o headaches, neck pain and vomiting that has been going on for about 2 weeks now  Pt has been taking ibuprofen and applying topical cream for sxs relief

## 2020-12-06 NOTE — ED Triage Notes (Signed)
Pt reports that she has had headaches for the past twos and well as neck pain, sent from UC to rule out meningitis, pt has had no fevers and able to move neck freely.

## 2020-12-07 ENCOUNTER — Emergency Department (HOSPITAL_COMMUNITY): Payer: Self-pay

## 2020-12-07 LAB — I-STAT BETA HCG BLOOD, ED (MC, WL, AP ONLY): I-stat hCG, quantitative: <5 m[IU]/mL (ref <5)

## 2020-12-07 MED ORDER — DIPHENHYDRAMINE HCL 50 MG/ML IJ SOLN
25.0000 mg | Freq: Once | INTRAMUSCULAR | Status: AC
  Administered 2020-12-07: 25 mg via INTRAVENOUS
  Filled 2020-12-07: qty 1

## 2020-12-07 MED ORDER — SODIUM CHLORIDE 0.9 % IV BOLUS
500.0000 mL | Freq: Once | INTRAVENOUS | Status: AC
  Administered 2020-12-07: 500 mL via INTRAVENOUS

## 2020-12-07 MED ORDER — PROCHLORPERAZINE EDISYLATE 10 MG/2ML IJ SOLN
10.0000 mg | Freq: Once | INTRAMUSCULAR | Status: AC
  Administered 2020-12-07: 10 mg via INTRAVENOUS
  Filled 2020-12-07: qty 2

## 2020-12-07 MED ORDER — METHOCARBAMOL 500 MG PO TABS: 500.0000 mg | ORAL_TABLET | Freq: Two times a day (BID) | ORAL | 0 refills | Status: DC

## 2020-12-07 MED ORDER — KETOROLAC TROMETHAMINE 30 MG/ML IJ SOLN
30.0000 mg | Freq: Once | INTRAMUSCULAR | Status: AC
  Administered 2020-12-07: 30 mg via INTRAVENOUS
  Filled 2020-12-07: qty 1
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
## 2020-12-07 NOTE — Discharge Instructions (Addendum)
Thank you for allowing me to care for you today in the Emergency Department.   You were seen today for a headache and back pain.  Your symptoms resolved after you were treated with migraine cocktail in the emergency department.  Her work-up in the emergency department was reassuring today.  Your symptoms and work-up are not consistent with meningitis.  Take 650 mg of Tylenol or 600 mg of ibuprofen with food every 6 hours for pain.  You can alternate between these 2 medications every 3 hours if your pain returns.  For instance, you can take Tylenol at noon, followed by a dose of ibuprofen at 3, followed by second dose of Tylenol and 6.  For neck pain, you can try taking 1 tablet of Robaxin up to 2 times daily.  Do not work or drive until you know how this medication impacts you.  It may make you drowsy so do not take with other sedating substances, such as alcohol.  Try to gently stretch the muscles of your upper back and neck as your pain allows.  I have attached some exercises on your discharge paperwork.  You can also try applying a heating pad or an ice pack for 15 to 20 minutes up to 3-4 times a day.  If your symptoms return and do not significantly improve with this regimen, please follow-up with primary care for reevaluation in the next week.  Return to the emergency department if you develop new numbness, weakness, loss of vision in one or both eyes, if you become unable to walk, or develop other new, concerning symptoms.
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
## 2021-06-29 ENCOUNTER — Other Ambulatory Visit: Payer: Self-pay

## 2021-06-29 ENCOUNTER — Encounter (HOSPITAL_COMMUNITY): Payer: Self-pay

## 2021-06-29 ENCOUNTER — Ambulatory Visit (HOSPITAL_COMMUNITY)
Admission: RE | Admit: 2021-06-29 | Discharge: 2021-06-29 | Disposition: A | Discharge status: Home / Self Care | Payer: Self-pay | Source: Ambulatory Visit | Attending: Internal Medicine | Admitting: Internal Medicine

## 2021-06-29 VITALS — BP 118/85 | HR 96 | Temp 98.6°F | Resp 18

## 2021-06-29 DIAGNOSIS — R519 Headache, unspecified: Secondary | ICD-10-CM

## 2021-06-29 DIAGNOSIS — T148XXA Other injury of unspecified body region, initial encounter: Secondary | ICD-10-CM

## 2021-06-29 MED ORDER — METHOCARBAMOL 500 MG PO TABS: 500.0000 mg | ORAL_TABLET | Freq: Three times a day (TID) | ORAL | 0 refills | Status: DC | PRN

## 2021-06-29 NOTE — ED Triage Notes (Signed)
Pt c/o of left ear and side of head pains that started 4 days ago when working with ropes at workout. Reports jumping or big movements make pain worse.

## 2021-06-29 NOTE — ED Provider Notes (Signed)
[facility]    CSN: [id number] Arrival date & time: 06/29/21  1604      History   Chief Complaint Chief Complaint  Patient presents with   appt 1600   Headache   Otalgia    HPI Danielle Arroyo is a 25 y.o. female otherwise healthy presents to urgent care today with complaints of pain behind left ear radiating to forehead.  Patient describes pain as sharp and intermittent that occurs mostly when working out.  Patient states she started working out with a trainer approximately 3 to 4 weeks ago and noticed onset of current symptoms approximately 4 days ago while doing jumping jacks and "rope throws".  Patient states symptoms greatly improved when at rest but does continue to have some mild "throbbing" pain in neck.  Patient with history of migraine headaches however this does not feel similar.  She denies any recent dizziness, weakness, fever or chills, numbness or tingling.  Patient has not tried anything for symptoms.   History reviewed. No pertinent past medical history.  There are no problems to display for this patient.   Past Surgical History:  Procedure Laterality Date   APPENDECTOMY      OB History   No obstetric history on file.      Home Medications    Prior to Admission medications   Medication Sig Start Date End Date Taking? Authorizing Provider  methocarbamol (ROBAXIN) 500 MG tablet Take 1 tablet (500 mg total) by mouth every 8 (eight) hours as needed for muscle spasms. 06/29/21  Yes Rolla Etienne, NP  medroxyPROGESTERone (DEPO-PROVERA) 150 MG/ML injection Inject 150 mg into the muscle every 3 (three) months.    [provider]  fluticasone (FLONASE) 50 MCG/ACT nasal spray Place 2 sprays into both nostrils daily. 10/16/16 11/03/19  Loren Racer, MD    Family History No family history on file.  Social History Social History   Tobacco Use   Smoking status: Never   Smokeless tobacco: Never  Substance Use Topics   Alcohol use: No    Drug use: No     Allergies   Flagyl [metronidazole]   Review of Systems As stated in HPI otherwise negative   Physical Exam Triage Vital Signs ED Triage Vitals  Enc Vitals Group     BP 06/29/21 1628 118/85     Pulse Rate 06/29/21 1628 96     Resp 06/29/21 1628 18     Temp 06/29/21 1628 98.6 F (37 C)     Temp Source 06/29/21 1628 Oral     SpO2 06/29/21 1628 97 %     Weight --      Height --      Head Circumference --      Peak Flow --      Pain Score 06/29/21 1627 9     Pain Loc --      Pain Edu? --      Excl. in GC? --    No data found.  Updated Vital Signs BP 118/85 (BP Location: Right Arm)   Pulse 96   Temp 98.6 F (37 C) (Oral)   Resp 18   LMP 06/12/2021   SpO2 97%   Visual Acuity Right Eye Distance:   Left Eye Distance:   Bilateral Distance:    Right Eye Near:   Left Eye Near:    Bilateral Near:     Physical Exam Constitutional:      General: She is not in acute distress.  Appearance: She is well-developed. She is not ill-appearing or toxic-appearing.  HENT:     Head: Normocephalic and atraumatic.  Eyes:     General: No scleral icterus.    Extraocular Movements: Extraocular movements intact.     Pupils: Pupils are equal, round, and reactive to light.  Cardiovascular:     Rate and Rhythm: Normal rate and regular rhythm.     Heart sounds: No murmur heard.   No friction rub. No gallop.  Pulmonary:     Effort: Pulmonary effort is normal.     Breath sounds: Normal breath sounds. No wheezing, rhonchi or rales.  Musculoskeletal:        General: Tenderness present. Normal range of motion.     Cervical back: Normal range of motion and neck supple.     Comments: Patient with tenderness upon palpation of left trapezius and sternocleidomastoid muscle  Lymphadenopathy:     Cervical: No cervical adenopathy.  Skin:    General: Skin is warm and dry.  Neurological:     Mental Status: She is alert and oriented to person, place, and time.     Cranial  Nerves: No facial asymmetry.     Motor: No weakness.     Gait: Gait normal.  Psychiatric:        Mood and Affect: Mood normal.        Behavior: Behavior normal.     UC Treatments / Results  Labs (all labs ordered are listed, but only abnormal results are displayed) Labs Reviewed - No data to display  EKG   Radiology No results found.  Procedures Procedures (including critical care time)  Medications Ordered in UC Medications - No data to display  Initial Impression / Assessment and Plan / UC Course  I have reviewed the triage vital signs and the nursing notes.  Pertinent labs & imaging results that were available during my care of the patient were reviewed by me and considered in my medical decision making (see chart for details).  Neck pain, headache -Based on exam patient's symptoms felt related to muscle strain to trapezius and sternocleidomastoid muscle.  Likely related to recent onset of intense exercise with trainer -Felt less likely to be neurological etiology based on history.  No significant findings on exam -NSAIDs, Robaxin as needed, heat and light stretching.  Instructed patient to avoid upper body workouts until symptoms improve -Return or follow-up for any worsening or persistent symptoms  Reviewed expections re: course of current medical issues. Questions answered. Outlined signs and symptoms indicating need for more acute intervention. Pt verbalized understanding. AVS given  Final Clinical Impressions(s) / UC Diagnoses   Final diagnoses:  Muscle strain  Nonintractable headache, unspecified chronicity pattern, unspecified headache type     Discharge Instructions      It appears you have a muscle strain of your trapezius muscle and your sternocleidomastoid muscle.  Would rest this area as discussed.  Light stretching daily, frequent heat to area.  You can try an over-the-counter Voltaren gel over affected area to see if this helps relieve pain.  You  can also take Motrin every 8 hours as needed.  Muscle relaxer prescribed for any muscle spasms or tension.  No driving after taking this medication as it can make you quite sleepy.  Please follow-up or be seen immediately for any worsening symptoms or should you develop any weakness, numbness or tingling.     ED Prescriptions     Medication Sig Dispense Auth. Provider   methocarbamol (  ROBAXIN) 500 MG tablet Take 1 tablet (500 mg total) by mouth every 8 (eight) hours as needed for muscle spasms. 30 tablet Rolla Etienne, NP      PDMP not reviewed this encounter.   Rolla Etienne, NP 06/29/21 2036

## 2021-06-29 NOTE — Discharge Instructions (Signed)
It appears you have a muscle strain of your trapezius muscle and your sternocleidomastoid muscle.  Would rest this area as discussed.  Light stretching daily, frequent heat to area.  You can try an over-the-counter Voltaren gel over affected area to see if this helps relieve pain.  You can also take Motrin every 8 hours as needed.  Muscle relaxer prescribed for any muscle spasms or tension.  No driving after taking this medication as it can make you quite sleepy.  Please follow-up or be seen immediately for any worsening symptoms or should you develop any weakness, numbness or tingling.

## 2021-07-21 ENCOUNTER — Other Ambulatory Visit: Payer: Self-pay

## 2021-07-21 ENCOUNTER — Emergency Department (HOSPITAL_COMMUNITY)
Admission: EM | Admit: 2021-07-21 | Discharge: 2021-07-22 | Disposition: A | Discharge status: Home / Self Care | Payer: Self-pay | Attending: Emergency Medicine | Admitting: Emergency Medicine

## 2021-07-21 ENCOUNTER — Encounter (HOSPITAL_COMMUNITY): Payer: Self-pay

## 2021-07-21 ENCOUNTER — Emergency Department (HOSPITAL_COMMUNITY): Payer: Self-pay

## 2021-07-21 DIAGNOSIS — R197 Diarrhea, unspecified: Secondary | ICD-10-CM | POA: Insufficient documentation

## 2021-07-21 DIAGNOSIS — N12 Tubulo-interstitial nephritis, not specified as acute or chronic: Secondary | ICD-10-CM | POA: Insufficient documentation

## 2021-07-21 LAB — COMPREHENSIVE METABOLIC PANEL
ALT: 15 U/L (ref 0–44)
AST: 24 U/L (ref 15–41)
Albumin: 4.5 g/dL (ref 3.5–5.0)
Alkaline Phosphatase: 56 U/L (ref 38–126)
Anion gap: 12 (ref 5–15)
BUN: 13 mg/dL (ref 6–20)
CO2: 20 mmol/L — ABNORMAL LOW (ref 22–32)
Calcium: 9.4 mg/dL (ref 8.9–10.3)
Chloride: 103 mmol/L (ref 98–111)
Creatinine, Ser: 0.79 mg/dL (ref 0.44–1.00)
GFR, Estimated: >60 mL/min (ref >60)
Glucose, Bld: 150 mg/dL — ABNORMAL HIGH (ref 70–99)
Potassium: 3.5 mmol/L (ref 3.5–5.1)
Sodium: 135 mmol/L (ref 135–145)
Total Bilirubin: 0.8 mg/dL (ref 0.3–1.2)
Total Protein: 8.2 g/dL — ABNORMAL HIGH (ref 6.5–8.1)

## 2021-07-21 LAB — URINALYSIS, ROUTINE W REFLEX MICROSCOPIC
Bilirubin Urine: NEGATIVE
Glucose, UA: NEGATIVE mg/dL
Ketones, ur: 80 mg/dL — AB
Nitrite: POSITIVE — AB
Protein, ur: 30 mg/dL — AB
RBC / HPF: >50 RBC/hpf — ABNORMAL HIGH (ref 0–5)
Specific Gravity, Urine: 1.023 (ref 1.005–1.030)
pH: 7 (ref 5.0–8.0)

## 2021-07-21 LAB — CBC WITH DIFFERENTIAL/PLATELET
Abs Immature Granulocytes: 0.07 10*3/uL (ref 0.00–0.07)
Basophils Absolute: 0 10*3/uL (ref 0.0–0.1)
Basophils Relative: 0 %
Eosinophils Absolute: 0 10*3/uL (ref 0.0–0.5)
Eosinophils Relative: 0 %
HCT: 41.1 % (ref 36.0–46.0)
Hemoglobin: 13.8 g/dL (ref 12.0–15.0)
Immature Granulocytes: 1 %
Lymphocytes Relative: 6 %
Lymphs Abs: 1 10*3/uL (ref 0.7–4.0)
MCH: 31.2 pg (ref 26.0–34.0)
MCHC: 33.6 g/dL (ref 30.0–36.0)
MCV: 92.8 fL (ref 80.0–100.0)
Monocytes Absolute: 0.5 10*3/uL (ref 0.1–1.0)
Monocytes Relative: 3 %
Neutro Abs: 13.6 10*3/uL — ABNORMAL HIGH (ref 1.7–7.7)
Neutrophils Relative %: 90 %
Platelets: 393 10*3/uL (ref 150–400)
RBC: 4.43 MIL/uL (ref 3.87–5.11)
RDW: 13.8 % (ref 11.5–15.5)
WBC: 15.1 10*3/uL — ABNORMAL HIGH (ref 4.0–10.5)
nRBC: 0 % (ref 0.0–0.2)

## 2021-07-21 LAB — WET PREP, GENITAL
Clue Cells Wet Prep HPF POC: NONE SEEN
Sperm: NONE SEEN
Trich, Wet Prep: NONE SEEN
Yeast Wet Prep HPF POC: NONE SEEN

## 2021-07-21 LAB — I-STAT CHEM 8, ED
BUN: 14 mg/dL (ref 6–20)
Calcium, Ion: 1.16 mmol/L (ref 1.15–1.40)
Chloride: 106 mmol/L (ref 98–111)
Creatinine, Ser: 0.8 mg/dL (ref 0.44–1.00)
Glucose, Bld: 147 mg/dL — ABNORMAL HIGH (ref 70–99)
HCT: 42 % (ref 36.0–46.0)
Hemoglobin: 14.3 g/dL (ref 12.0–15.0)
Potassium: 3.8 mmol/L (ref 3.5–5.1)
Sodium: 139 mmol/L (ref 135–145)
TCO2: 23 mmol/L (ref 22–32)

## 2021-07-21 LAB — I-STAT BETA HCG BLOOD, ED (MC, WL, AP ONLY): I-stat hCG, quantitative: <5 m[IU]/mL (ref <5)

## 2021-07-21 LAB — MAGNESIUM: Magnesium: 1.6 mg/dL — ABNORMAL LOW (ref 1.7–2.4)

## 2021-07-21 LAB — LIPASE, BLOOD: Lipase: 20 U/L (ref 11–51)

## 2021-07-21 MED ORDER — ONDANSETRON 4 MG PO TBDP
4.0000 mg | ORAL_TABLET | Freq: Three times a day (TID) | ORAL | 0 refills | Status: DC | PRN
Start: 2021-07-21 — End: 2024-06-16

## 2021-07-21 MED ORDER — POTASSIUM CHLORIDE 20 MEQ PO PACK
40.0000 meq | PACK | Freq: Once | ORAL | Status: AC
  Administered 2021-07-21: 40 meq via ORAL
  Filled 2021-07-21: qty 2

## 2021-07-21 MED ORDER — LACTATED RINGERS IV BOLUS
1000.0000 mL | Freq: Once | INTRAVENOUS | Status: AC
  Administered 2021-07-21: 1000 mL via INTRAVENOUS

## 2021-07-21 MED ORDER — ONDANSETRON HCL 4 MG/2ML IJ SOLN
4.0000 mg | Freq: Once | INTRAMUSCULAR | Status: DC
  Filled 2021-07-21: qty 2

## 2021-07-21 MED ORDER — MAGNESIUM SULFATE 2 GM/50ML IV SOLN
2.0000 g | Freq: Once | INTRAVENOUS | Status: AC
  Administered 2021-07-21: 2 g via INTRAVENOUS
  Filled 2021-07-21: qty 50

## 2021-07-21 MED ORDER — HYDROMORPHONE HCL 1 MG/ML IJ SOLN
0.5000 mg | Freq: Once | INTRAMUSCULAR | Status: AC
  Administered 2021-07-21: 0.5 mg via INTRAVENOUS
  Filled 2021-07-21: qty 1

## 2021-07-21 MED ORDER — IOHEXOL 350 MG/ML SOLN
80.0000 mL | Freq: Once | INTRAVENOUS | Status: AC | PRN
  Administered 2021-07-21: 80 mL via INTRAVENOUS

## 2021-07-21 MED ORDER — AMOXICILLIN-POT CLAVULANATE 875-125 MG PO TABS: 1.0000 | ORAL_TABLET | Freq: Two times a day (BID) | ORAL | 0 refills | Status: AC

## 2021-07-21 MED ORDER — METOCLOPRAMIDE HCL 5 MG/ML IJ SOLN
10.0000 mg | Freq: Once | INTRAMUSCULAR | Status: AC
  Administered 2021-07-21: 10 mg via INTRAVENOUS
  Filled 2021-07-21: qty 2

## 2021-07-21 MED ORDER — SODIUM CHLORIDE 0.9 % IV SOLN
1.0000 g | Freq: Once | INTRAVENOUS | Status: AC
  Administered 2021-07-21: 1 g via INTRAVENOUS
  Filled 2021-07-21: qty 10

## 2021-07-21 NOTE — ED Notes (Signed)
Pelvic done by provider

## 2021-07-21 NOTE — ED Triage Notes (Signed)
Pt very uncomfortable, looks pale.  Said that her periods have been irregular, does not know if she is pregnant or not

## 2021-07-21 NOTE — ED Provider Notes (Signed)
Abdominal [facility]-EMERGENCY DEPT Provider Note   CSN: [id number] Arrival date & time: 07/21/21  1900     History Chief Complaint  Patient presents with   Abdominal Pain    Pt started her menstrual cycle today, has been having nausea, vomiting and diarrhea for the last 2 hours. Has severe diffuse abdominal pain throughout her abdomen    Danielle Arroyo is a 25 y.o. female.   Abdominal Pain Pain location:  Generalized Pain quality: burning and sharp   Pain radiates to:  Back Pain severity:  Severe Onset quality:  Sudden Duration:  12 hours Timing:  Constant Progression:  Unchanged Chronicity:  New Associated symptoms: chills, diarrhea, fever, nausea, vaginal bleeding (started menstrual period yesterday), vaginal discharge (watery) and vomiting   Associated symptoms: no chest pain, no cough, no dysuria, no hematuria, no shortness of breath and no sore throat   Diarrhea:    Quality:  Watery   Severity:  Moderate   Duration:  12 hours   Progression:  Unchanged Fever:    Temp source:  Subjective Nausea:    Severity:  Severe   Duration:  12 hours   Timing:  Intermittent Vaginal discharge:    Quality:  Watery     History reviewed. No pertinent past medical history.  There are no problems to display for this patient.   Past Surgical History:  Procedure Laterality Date   APPENDECTOMY       OB History   No obstetric history on file.     History reviewed. No pertinent family history.  Social History   Tobacco Use   Smoking status: Never   Smokeless tobacco: Never  Substance Use Topics   Alcohol use: No   Drug use: No    Home Medications Prior to Admission medications   Medication Sig Start Date End Date Taking? Authorizing Provider  amoxicillin-clavulanate (AUGMENTIN) 875-125 MG tablet Take 1 tablet by mouth 2 (two) times daily for 7 days. 07/21/21 07/28/21 Yes Gloris Manchester, MD  ondansetron (ZOFRAN ODT) 4 MG disintegrating tablet  Take 1 tablet (4 mg total) by mouth every 8 (eight) hours as needed for nausea or vomiting. 07/21/21  Yes Gloris Manchester, MD  medroxyPROGESTERone (DEPO-PROVERA) 150 MG/ML injection Inject 150 mg into the muscle every 3 (three) months.    [provider]  methocarbamol (ROBAXIN) 500 MG tablet Take 1 tablet (500 mg total) by mouth every 8 (eight) hours as needed for muscle spasms. 06/29/21   Rolla Etienne, NP  fluticasone (FLONASE) 50 MCG/ACT nasal spray Place 2 sprays into both nostrils daily. 10/16/16 11/03/19  Loren Racer, MD    Allergies    Flagyl [metronidazole]  Review of Systems   Review of Systems  Constitutional:  Positive for appetite change, chills and fever.  HENT:  Negative for ear pain and sore throat.   Eyes:  Negative for pain and visual disturbance.  Respiratory:  Negative for cough and shortness of breath.   Cardiovascular:  Negative for chest pain and palpitations.  Gastrointestinal:  Positive for abdominal pain, diarrhea, nausea and vomiting.  Genitourinary:  Positive for vaginal bleeding (started menstrual period yesterday) and vaginal discharge (watery). Negative for dysuria, flank pain, hematuria and pelvic pain.  Musculoskeletal:  Negative for arthralgias, back pain, myalgias and neck pain.  Skin:  Negative for color change and rash.  Neurological:  Negative for dizziness, seizures, syncope, light-headedness and numbness.  All other systems reviewed and are negative.  Physical Exam Updated Vital Signs BP 128/68 (  BP Location: Right Arm)   Pulse 64   Temp 98.2 F (36.8 C) (Oral)   Resp 18   Ht 5\' 3"  (1.6 m)   Wt 95.3 kg   LMP 07/21/2021   SpO2 99%   BMI 37.20 kg/m   Physical Exam Vitals and nursing note reviewed. Exam conducted with a chaperone present.  Constitutional:      General: She is not in acute distress.    Appearance: She is well-developed. She is not ill-appearing, toxic-appearing or diaphoretic.  HENT:     Head: Normocephalic and  atraumatic.     Mouth/Throat:     Mouth: Mucous membranes are moist.     Pharynx: Oropharynx is clear.  Eyes:     Conjunctiva/sclera: Conjunctivae normal.  Cardiovascular:     Rate and Rhythm: Normal rate and regular rhythm.     Heart sounds: No murmur heard. Pulmonary:     Effort: Pulmonary effort is normal. No respiratory distress.     Breath sounds: Normal breath sounds. No wheezing or rales.  Abdominal:     Palpations: Abdomen is soft.     Tenderness: There is no abdominal tenderness. There is no right CVA tenderness or left CVA tenderness.  Genitourinary:    General: Normal vulva.     Vagina: Normal. No signs of injury and foreign body. No erythema.     Cervix: Cervical bleeding (dark blood, scant amout, consistent with current menses) present. No cervical motion tenderness, discharge, friability, lesion or erythema.     Adnexa:        Right: No mass, tenderness or fullness.         Left: No mass, tenderness or fullness.    Musculoskeletal:     Cervical back: Neck supple.  Skin:    General: Skin is warm and dry.     Coloration: Skin is not cyanotic or jaundiced.  Neurological:     General: No focal deficit present.     Mental Status: She is alert and oriented to person, place, and time.     Cranial Nerves: No cranial nerve deficit.     Motor: No weakness.    ED Results / Procedures / Treatments   Labs (all labs ordered are listed, but only abnormal results are displayed) Labs Reviewed  WET PREP, GENITAL - Abnormal; Notable for the following components:      Result Value   WBC, Wet Prep HPF POC MODERATE (*)    All other components within normal limits  COMPREHENSIVE METABOLIC PANEL - Abnormal; Notable for the following components:   CO2 20 (*)    Glucose, Bld 150 (*)    Total Protein 8.2 (*)    All other components within normal limits  CBC WITH DIFFERENTIAL/PLATELET - Abnormal; Notable for the following components:   WBC 15.1 (*)    Neutro Abs 13.6 (*)    All  other components within normal limits  URINALYSIS, ROUTINE W REFLEX MICROSCOPIC - Abnormal; Notable for the following components:   APPearance HAZY (*)    Hgb urine dipstick MODERATE (*)    Ketones, ur 80 (*)    Protein, ur 30 (*)    Nitrite POSITIVE (*)    Leukocytes,Ua MODERATE (*)    RBC / HPF >50 (*)    Bacteria, UA MANY (*)    All other components within normal limits  MAGNESIUM - Abnormal; Notable for the following components:   Magnesium 1.6 (*)    All other components within normal limits  I-STAT CHEM 8, ED - Abnormal; Notable for the following components:   Glucose, Bld 147 (*)    All other components within normal limits  URINE CULTURE  LIPASE, BLOOD  I-STAT BETA HCG BLOOD, ED ([facility], [facility], [facility] ONLY)  GC/CHLAMYDIA PROBE AMP ([place]) NOT AT [facility]    EKG EKG Interpretation  Date/Time:  Saturday July 21 2021 20:24:40 EDT Ventricular Rate:  68 PR Interval:  138 QRS Duration: 75 QT Interval:  378 QTC Calculation: 402 R Axis:   47 Text Interpretation: Sinus rhythm Borderline T wave abnormalities Confirmed by Gloris Manchester ([id number]) on 07/21/2021 8:42:10 PM  Radiology CT ABDOMEN PELVIS W CONTRAST  Result Date: 07/21/2021 CLINICAL DATA:  Abdominal pain with nausea and vomiting EXAM: CT ABDOMEN AND PELVIS WITH CONTRAST TECHNIQUE: Multidetector CT imaging of the abdomen and pelvis was performed using the standard protocol following bolus administration of intravenous contrast. CONTRAST:  41mL OMNIPAQUE IOHEXOL 350 MG/ML SOLN COMPARISON:  None. FINDINGS: LOWER CHEST: Normal. HEPATOBILIARY: Normal hepatic contours. No intra- or extrahepatic biliary dilatation. The gallbladder is normal. PANCREAS: Normal pancreas. No ductal dilatation or peripancreatic fluid collection. SPLEEN: Normal. ADRENALS/URINARY TRACT: The adrenal glands are normal. No hydronephrosis, nephroureterolithiasis or solid renal mass. The urinary bladder is normal for degree of distention STOMACH/BOWEL: There is no  hiatal hernia. Normal duodenal course and caliber. No small bowel dilatation or inflammation. No focal colonic abnormality. The appendix is not visualized. No right lower quadrant inflammation or free fluid. VASCULAR/LYMPHATIC: Normal course and caliber of the major abdominal vessels. No abdominal or pelvic lymphadenopathy. REPRODUCTIVE: Normal uterus. No adnexal mass. MUSCULOSKELETAL. No bony spinal canal stenosis or focal osseous abnormality. OTHER: None. IMPRESSION: No acute abnormality of the abdomen or pelvis. Electronically Signed   By: Deatra Robinson M.D.   On: 07/21/2021 23:28   DG Chest Portable 1 View  Result Date: 07/21/2021 CLINICAL DATA:  Vomiting, shortness of breath EXAM: PORTABLE CHEST 1 VIEW COMPARISON:  10/16/2016 FINDINGS: Heart and mediastinal contours are within normal limits. No focal opacities or effusions. No acute bony abnormality. IMPRESSION: No active disease. Electronically Signed   By: Charlett Nose M.D.   On: 07/21/2021 20:33    Procedures Procedures   Medications Ordered in ED Medications  HYDROmorphone (DILAUDID) injection 0.5 mg (0.5 mg Intravenous Given 07/21/21 2014)  metoCLOPramide (REGLAN) injection 10 mg (10 mg Intravenous Given 07/21/21 2014)  lactated ringers bolus 1,000 mL (0 mLs Intravenous Stopped 07/21/21 2232)  magnesium sulfate IVPB 2 g 50 mL (0 g Intravenous Stopped 07/21/21 2232)  iohexol (OMNIPAQUE) 350 MG/ML injection 80 mL (80 mLs Intravenous Contrast Given 07/21/21 2307)  cefTRIAXone (ROCEPHIN) 1 g in sodium chloride 0.9 % 100 mL IVPB (0 g Intravenous Stopped 07/22/21 0028)  potassium chloride (KLOR-CON) packet 40 mEq (40 mEq Oral Given 07/21/21 2338)    ED Course  I have reviewed the triage vital signs and the nursing notes.  Pertinent labs & imaging results that were available during my care of the patient were reviewed by me and considered in my medical decision making (see chart for details).    MDM Rules/Calculators/A&P                           Patient is a 25 year old female who presents for abdominal pain, nausea, vomiting, and p.o. intolerance since this morning.  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  She also reports watery diarrhea today, recent watery vaginal discharge, and  the onset of her menstrual period yesterday.  On arrival, she is in discomfort.  She describes the area of pain as midline, extending from her epigastrium to her lower abdomen.  On exam, she does not have any tenderness.  Vital signs are normal.  Given her p.o. intolerance, bolus of IV fluids was ordered.  Medication was ordered for symptomatic relief of pain and nausea.  Diagnostic lab studies were initiated.  On reassessment, patient reports significant improvement in her symptoms.  On further reassessment, she reports recurrence of nausea.  CT scan of abdomen pelvis was ordered.  Lab results were notable for nitrite positive UTI.  Patient was given dose of ceftriaxone.  On further reassessment, patient asymptomatic.  She was able tolerate p.o. intake without difficulty.  She was prescribed continued antibiotics for treatment of pyelonephritis.  She was advised to return to the ED if she does experience worsening symptoms despite antibiotics.  Additionally, Zofran was prescribed, to be taken only as needed.  She was discharged in good condition.  Final Clinical Impression(s) / ED Diagnoses Final diagnoses:  Pyelonephritis    Rx / DC Orders ED Discharge Orders          Ordered    amoxicillin-clavulanate (AUGMENTIN) 875-125 MG tablet  2 times daily        07/21/21 2322    ondansetron (ZOFRAN ODT) 4 MG disintegrating tablet  Every 8 hours PRN        07/21/21 2322             Gloris Manchester, MD 07/22/21 1408

## 2021-07-21 NOTE — Discharge Instructions (Addendum)
There were prescriptions sent to the [facility] on [address]  1 of these is called Augmentin.  This is an antibiotic to treat your current urine infection.  Take as prescribed.  The other medication is called Zofran.  This is to treat nausea.  Take this 1 only as needed.  If you develop worsening symptoms despite antibiotics, please return to the emergency department.  Follow-up with your PCP or your [facility] doctor for results of cultures.  These results should be available within the next 24 to 48 hours.

## 2021-07-23 LAB — URINE CULTURE: Culture: >=100000 — AB

## 2021-07-23 LAB — GC/CHLAMYDIA PROBE AMP (~~LOC~~) NOT AT ARMC
Chlamydia: NEGATIVE
Comment: NEGATIVE
Comment: NORMAL
Neisseria Gonorrhea: NEGATIVE

## 2021-07-24 NOTE — Progress Notes (Deleted)
ED Antimicrobial Stewardship Positive Culture Follow Up   Danielle Arroyo is an 25 y.o. female who presented to [facility] on 07/21/2021 with a chief complaint of  Chief Complaint  Patient presents with   Abdominal Pain    Pt started her menstrual cycle today, has been having nausea, vomiting and diarrhea for the last 2 hours. Has severe diffuse abdominal pain throughout her abdomen    Recent Results (from the past 720 hour(s))  Urine Culture     Status: Abnormal   Collection Time: 07/21/21  8:09 PM   Specimen: Urine, Clean Catch  Result Value Ref Range Status   Specimen Description   Final    URINE, CLEAN CATCH Performed at [facility], [address], [place], [place] [address]    Special Requests   Final    NONE Performed at [facility], [address], [place], [place] [address]    Culture (A)  Final    >=100,000 COLONIES/mL STREPTOCOCCUS MITIS/ORALIS WITHIN MIXED ORGANISMS Performed at [facility], [address] [place], [place] [address]    Report Status 07/23/2021 FINAL  Final  Wet prep, genital     Status: Abnormal   Collection Time: 07/21/21 10:46 PM   Specimen: PATH Cytology Cervicovaginal Ancillary Only  Result Value Ref Range Status   Yeast Wet Prep HPF POC NONE SEEN NONE SEEN Final   Trich, Wet Prep NONE SEEN NONE SEEN Final   Clue Cells Wet Prep HPF POC NONE SEEN NONE SEEN Final   WBC, Wet Prep HPF POC MODERATE (A) NONE SEEN Final   Sperm NONE SEEN  Final    Comment: Performed at [facility], [address] [place], [place] [address]    Agree with treatment plan:  Amoxicillin-Clavulanate (Augmentin) 875-125 mg 1 tablet PO BID x 7 d   Asencion Gowda PharmD/MBA [address] [place] of 2023

## 2022-01-02 ENCOUNTER — Ambulatory Visit: Payer: Self-pay | Admitting: Internal Medicine

## 2022-01-18 IMAGING — CT CT ABD-PELV W/ CM
2 of 4 series · 17 of 46 positions shown, 19 images · IV contrast (omnipaque)
Comparison: None.

CLINICAL DATA: Abdominal pain with nausea and vomiting

EXAM:
CT ABDOMEN AND PELVIS WITH CONTRAST
TECHNIQUE: Multidetector CT imaging of the abdomen and pelvis was performed
using the standard protocol following bolus administration of
intravenous contrast.
CONTRAST:  80mL OMNIPAQUE IOHEXOL 350 MG/ML SOLN

[Series 2: axial st · axial · 0.80mm/px · z∈[-442,-72]mm · 14 of 84 slices shown, 16 images]
[im 5/84  soft-tissue]
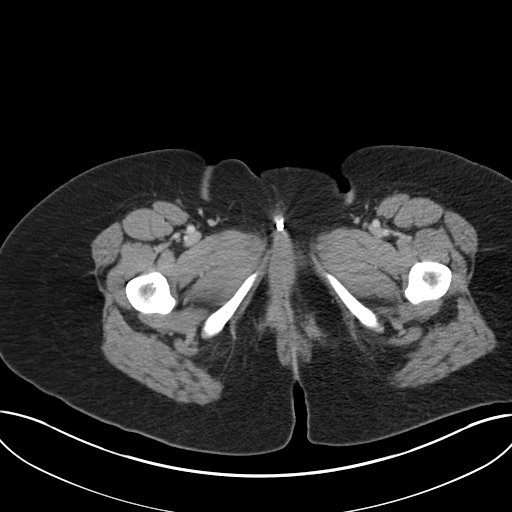
[im 5/84  bone]
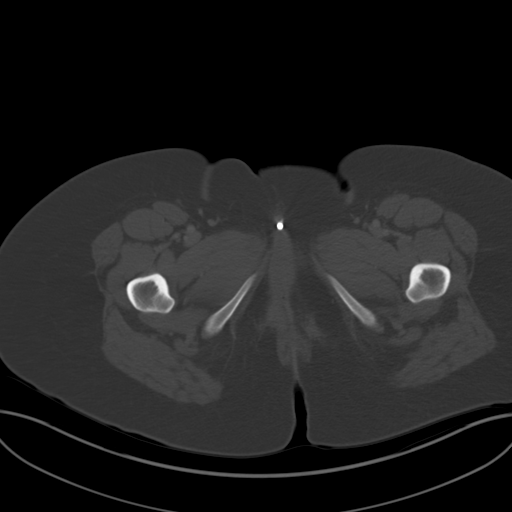
[im 10/84  soft-tissue]
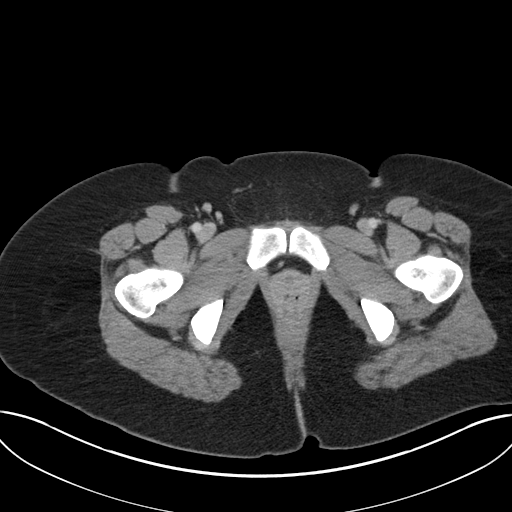
[im 19/84  soft-tissue]
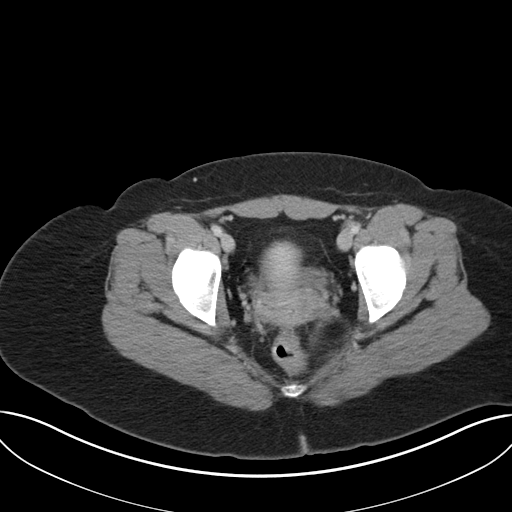
[im 24/84  soft-tissue]
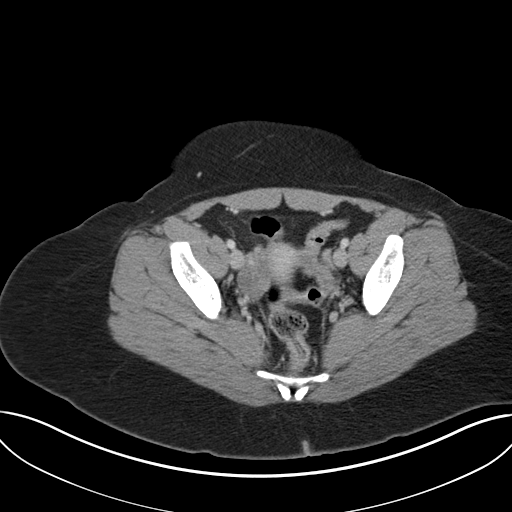
[im 28/84  soft-tissue]
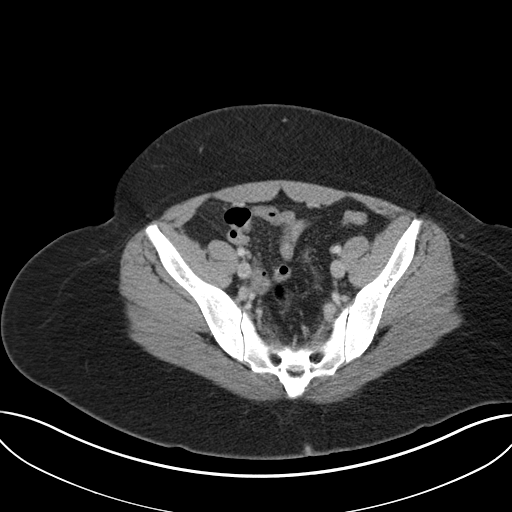
[im 33/84  soft-tissue]
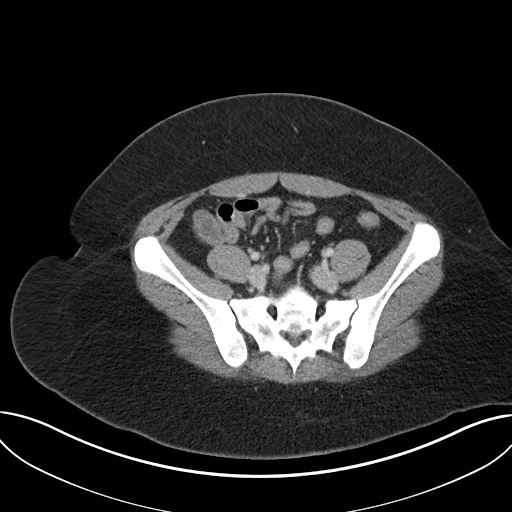
[im 37/84  soft-tissue]
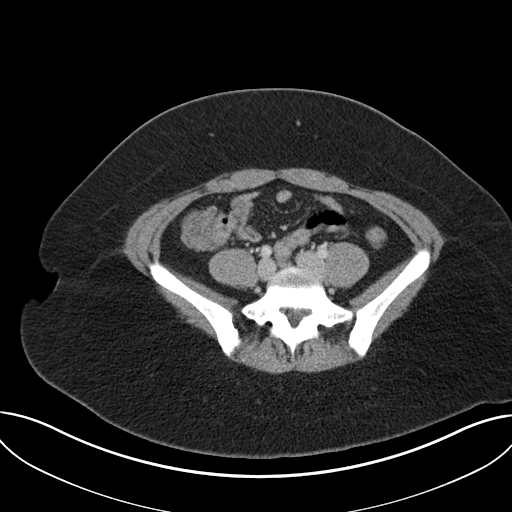
[im 47/84  soft-tissue]
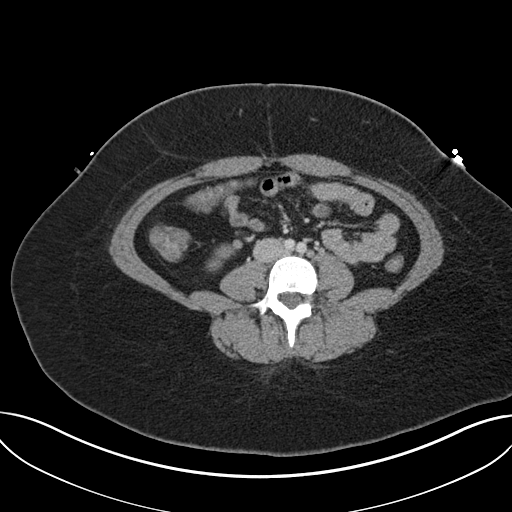
[im 51/84  soft-tissue]
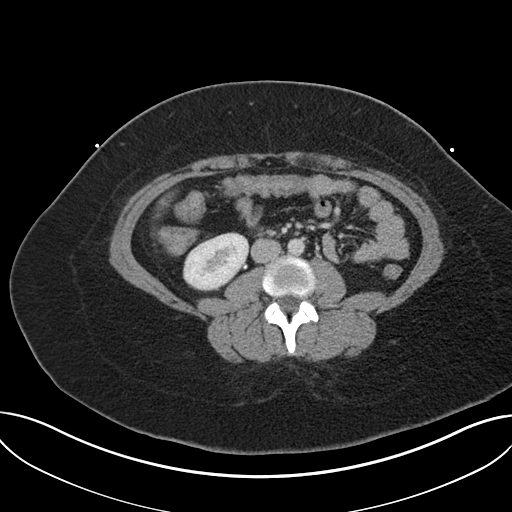
[im 51/84  bone]
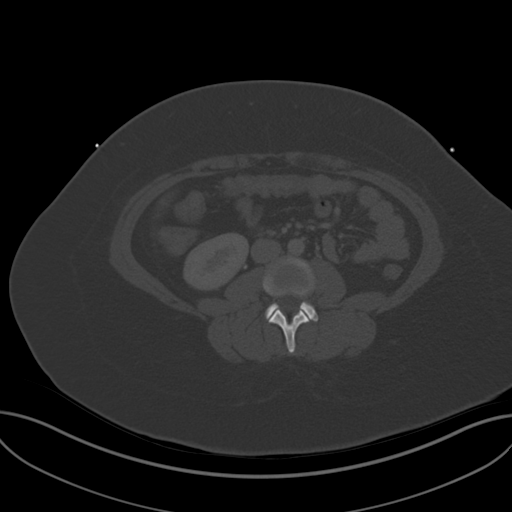
[im 56/84  soft-tissue]
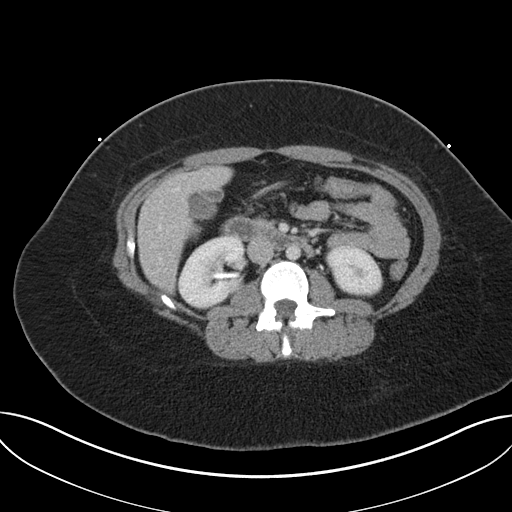
[im 60/84  soft-tissue]
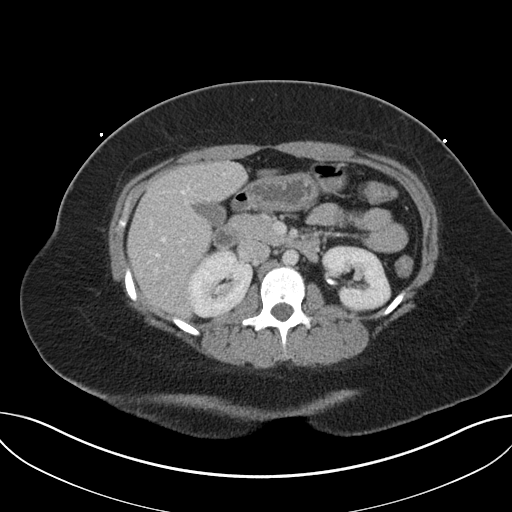
[im 65/84  soft-tissue]
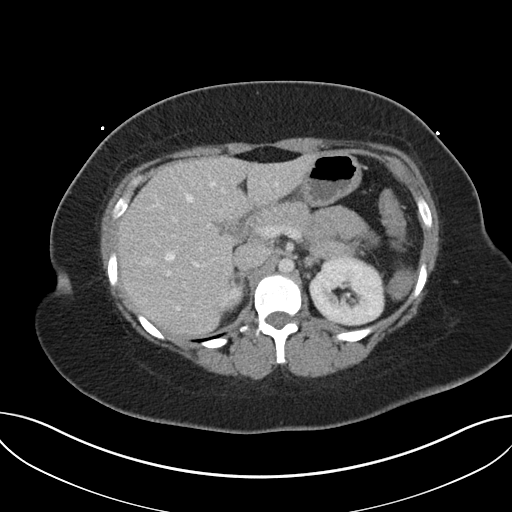
[im 74/84  soft-tissue]
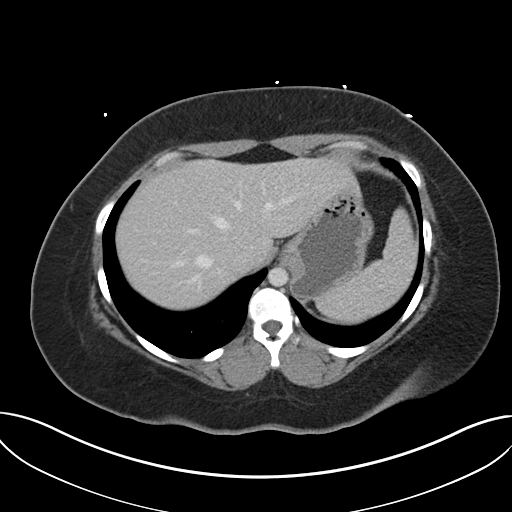
[im 79/84  soft-tissue]
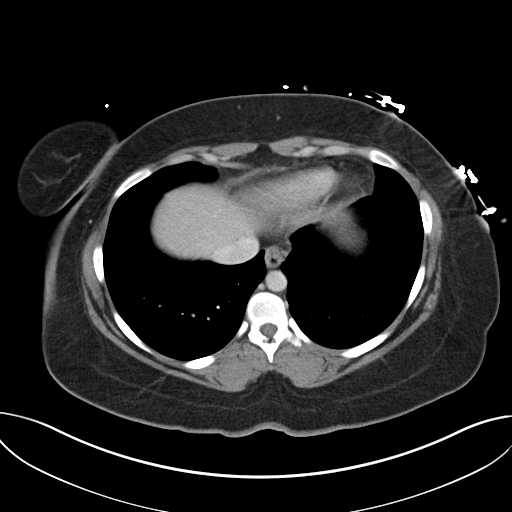

[Series 5: coronal st · coronal · 0.78mm/px · 3 of 160 slices shown]
[im 54/160  soft-tissue]
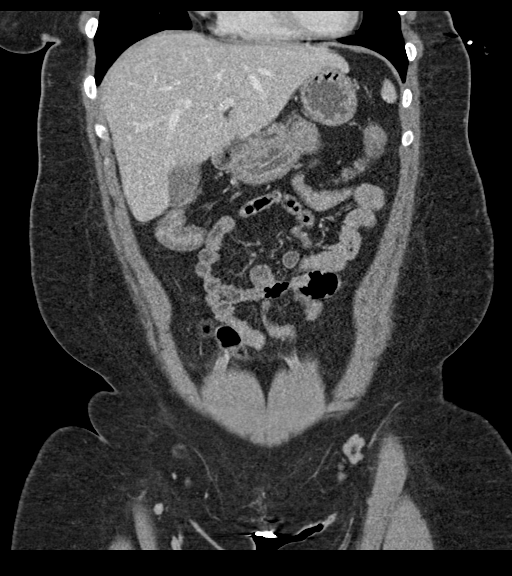
[im 71/160  soft-tissue]
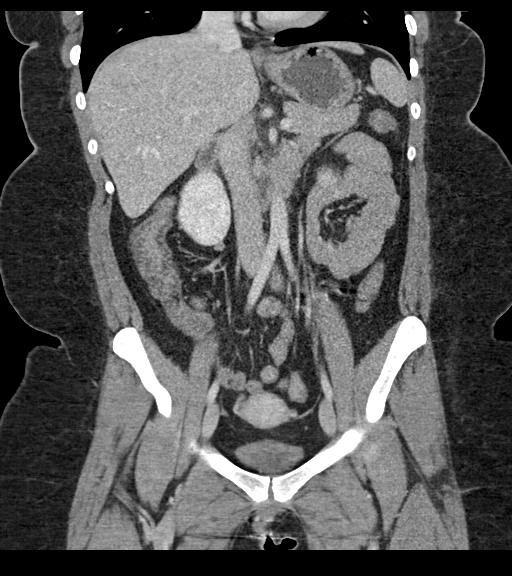
[im 89/160  soft-tissue]
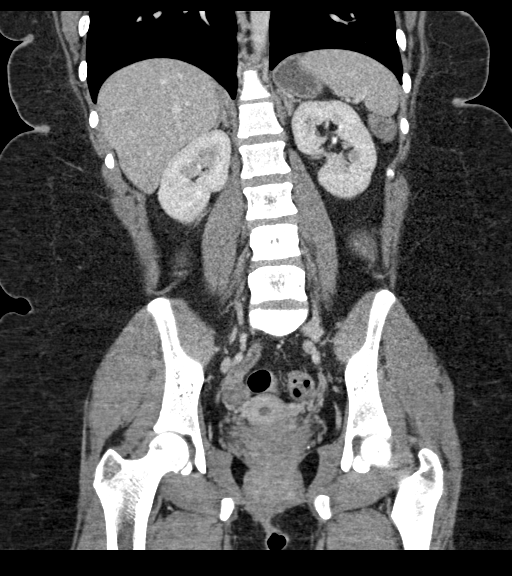

[17 of 46 positions shown; findings below may reference images not displayed]

FINDINGS: LOWER CHEST: Normal.

HEPATOBILIARY: Normal hepatic contours. No intra- or extrahepatic
biliary dilatation. The gallbladder is normal.

PANCREAS: Normal pancreas. No ductal dilatation or peripancreatic
fluid collection.

SPLEEN: Normal.

ADRENALS/URINARY TRACT: The adrenal glands are normal. No
hydronephrosis, nephroureterolithiasis or solid renal mass. The
urinary bladder is normal for degree of distention

STOMACH/BOWEL: There is no hiatal hernia. Normal duodenal course and
caliber. No small bowel dilatation or inflammation. No focal colonic
abnormality. The appendix is not visualized. No right lower quadrant
inflammation or free fluid.

VASCULAR/LYMPHATIC: Normal course and caliber of the major abdominal
vessels. No abdominal or pelvic lymphadenopathy.

REPRODUCTIVE: Normal uterus. No adnexal mass.

MUSCULOSKELETAL. No bony spinal canal stenosis or focal osseous
abnormality.

OTHER: None.
IMPRESSION: No acute abnormality of the abdomen or pelvis.

## 2022-01-30 DIAGNOSIS — N760 Acute vaginitis: Secondary | ICD-10-CM | POA: Diagnosis not present

## 2022-01-30 DIAGNOSIS — R102 Pelvic and perineal pain: Secondary | ICD-10-CM | POA: Diagnosis not present

## 2022-01-30 DIAGNOSIS — B9689 Other specified bacterial agents as the cause of diseases classified elsewhere: Secondary | ICD-10-CM | POA: Diagnosis not present

## 2022-01-30 DIAGNOSIS — R399 Unspecified symptoms and signs involving the genitourinary system: Secondary | ICD-10-CM | POA: Diagnosis not present

## 2022-01-30 DIAGNOSIS — N939 Abnormal uterine and vaginal bleeding, unspecified: Secondary | ICD-10-CM | POA: Diagnosis not present

## 2022-02-07 DIAGNOSIS — Z113 Encounter for screening for infections with a predominantly sexual mode of transmission: Secondary | ICD-10-CM | POA: Diagnosis not present

## 2022-02-07 DIAGNOSIS — Z118 Encounter for screening for other infectious and parasitic diseases: Secondary | ICD-10-CM | POA: Diagnosis not present

## 2022-02-07 DIAGNOSIS — Z01419 Encounter for gynecological examination (general) (routine) without abnormal findings: Secondary | ICD-10-CM | POA: Diagnosis not present

## 2022-02-07 DIAGNOSIS — N926 Irregular menstruation, unspecified: Secondary | ICD-10-CM | POA: Diagnosis not present

## 2022-02-07 DIAGNOSIS — R102 Pelvic and perineal pain: Secondary | ICD-10-CM | POA: Diagnosis not present

## 2022-06-04 ENCOUNTER — Ambulatory Visit: Payer: Self-pay

## 2022-11-18 ENCOUNTER — Ambulatory Visit (HOSPITAL_COMMUNITY): Payer: Self-pay

## 2022-11-19 ENCOUNTER — Ambulatory Visit: Payer: Federal, State, Local not specified - PPO | Admitting: Family Medicine

## 2022-12-02 ENCOUNTER — Ambulatory Visit: Payer: Federal, State, Local not specified - PPO | Admitting: Family Medicine

## 2022-12-02 DIAGNOSIS — G44209 Tension-type headache, unspecified, not intractable: Secondary | ICD-10-CM | POA: Diagnosis not present

## 2022-12-04 ENCOUNTER — Ambulatory Visit: Payer: Federal, State, Local not specified - PPO | Admitting: Family Medicine

## 2022-12-09 ENCOUNTER — Encounter: Payer: Self-pay | Admitting: Family Medicine

## 2022-12-09 ENCOUNTER — Ambulatory Visit (INDEPENDENT_AMBULATORY_CARE_PROVIDER_SITE_OTHER): Payer: Federal, State, Local not specified - PPO | Admitting: Family Medicine

## 2022-12-09 VITALS — BP 124/87 | HR 72 | Temp 98.7°F | Ht 63.0 in | Wt 192.4 lb

## 2022-12-09 DIAGNOSIS — F32A Depression, unspecified: Secondary | ICD-10-CM

## 2022-12-09 DIAGNOSIS — Z3202 Encounter for pregnancy test, result negative: Secondary | ICD-10-CM | POA: Diagnosis not present

## 2022-12-09 DIAGNOSIS — F419 Anxiety disorder, unspecified: Secondary | ICD-10-CM | POA: Diagnosis not present

## 2022-12-09 LAB — POCT URINE PREGNANCY: Preg Test, Ur: NEGATIVE

## 2022-12-09 MED ORDER — SERTRALINE HCL 25 MG PO TABS: 25.0000 mg | ORAL_TABLET | Freq: Every day | ORAL | 1 refills | Status: DC

## 2022-12-09 NOTE — Patient Instructions (Addendum)
[facility] [place] No appointment necessary  Open 24/7.

## 2022-12-09 NOTE — Progress Notes (Signed)
New Patient Office Visit  Subjective    Patient ID: Danielle Arroyo, female    DOB: June 30, 1996  Age: 27 y.o. MRN: [id number]  CC:  Chief Complaint  Patient presents with   Establish Care   Back Pain    Starts 1.5 ago. She thinks that it is from her job. She states that it is constant. She switched jobs, the pain decreased and now is back to the same position.     HPI Danielle Arroyo presents to establish care with this practice. She is new to me. Has not had PCP for long time. Went to urgent care on 12/02/22 for back pain. She was told her back pain may be related to her depression. Soreness in upper neck/back that will cause her to get a migraine. Denies changes in activities, no injury. Points to soreness in trapezius muscles bilaterally.   Wants to discuss depression and anxiety. Denies recent triggers, she is sexually active, will rule out pregnancy today. Does not have relationship with her own parents and this is weighing on her. She is a Armed forces operational officer, she is a make up Training and development officer, "its my happy place"   Reviewed PHQ-9 results, denies wanting to physically harm herself, talks to EAP with work weekly. Wants to live for self, wants to feel better.  Interested in starting on medication and get psychiatric referral today. Explained risk of feeling worse before feeling better and having increased thoughts of self-harm when starting on medication for depression. Encouraged her to let a trusted friend or family member know what she is going through.    Discussed [facility] and how it is available to her 24/7 if she needs urgent mental health care.  She is [facility] resident. Address given.   [place] Office Visit from 12/09/2022 in [place] at [facility] Total Score 20  Long discussion around self-harm. Denies plan to harm self, has occasional thoughts. Will start on sertraline 25 mg today with psychiatric referral. [facility] services and  their availability 24/7 if she is in need of immediate psychiatric care. She has been speaking with EAP counselor weekly through her work. Would benefit from more intensive therapy.  GAD-7 Score                                 17    Outpatient Encounter Medications as of 12/09/2022  Medication Sig   sertraline (ZOLOFT) 25 MG tablet Take 1 tablet (25 mg total) by mouth daily.   valACYclovir (VALTREX) 1000 MG tablet Take 1,000 mg by mouth 2 (two) times daily.   etonogestrel-ethinyl estradiol (NUVARING) 0.12-0.015 MG/24HR vaginal ring  (Patient not taking: Reported on 12/09/2022)   medroxyPROGESTERone (DEPO-PROVERA) 150 MG/ML injection Inject 150 mg into the muscle every 3 (three) months. (Patient not taking: Reported on 12/09/2022)   methocarbamol (ROBAXIN) 500 MG tablet Take 1 tablet (500 mg total) by mouth every 8 (eight) hours as needed for muscle spasms. (Patient not taking: Reported on 12/09/2022)   ondansetron (ZOFRAN ODT) 4 MG disintegrating tablet Take 1 tablet (4 mg total) by mouth every 8 (eight) hours as needed for nausea or vomiting. (Patient not taking: Reported on 12/09/2022)   [DISCONTINUED] fluticasone (FLONASE) 50 MCG/ACT nasal spray Place 2 sprays into both nostrils daily.   No facility-administered encounter medications on file as of 12/09/2022.    Past Medical History:  Diagnosis Date  Anxiety and depression     Past Surgical History:  Procedure Laterality Date   APPENDECTOMY      History reviewed. No pertinent family history.  Social History   Socioeconomic History   Marital status: Single    Spouse name: Not on file   Number of children: Not on file   Years of education: Not on file   Highest education level: Not on file  Occupational History   Not on file  Tobacco Use   Smoking status: Never   Smokeless tobacco: Never  Substance and Sexual Activity   Alcohol use: No   Drug use: No   Sexual activity: Not on file  Other Topics Concern   Not on file  Social  History Narrative   Not on file   Social Determinants of Health   Financial Resource Strain: Not on file  Food Insecurity: Not on file  Transportation Needs: Not on file  Physical Activity: Not on file  Stress: Not on file  Social Connections: Not on file  Intimate Partner Violence: Not on file    Review of Systems  Constitutional:  Negative for chills and fever.  Eyes:  Negative for blurred vision and double vision.  Respiratory:  Negative for shortness of breath.   Cardiovascular:  Negative for chest pain.  Gastrointestinal:  Negative for abdominal pain, nausea and vomiting.  Musculoskeletal:  Positive for back pain (bilateral trapezius muscles soreness). Negative for neck pain.  Neurological:  Negative for dizziness and headaches.  Psychiatric/Behavioral:  Positive for depression. Negative for suicidal ideas (has thoughts, denies plans to harm self.).         Objective    BP 124/87 (BP Location: Left Arm, Patient Position: Sitting, Cuff Size: Large)   Pulse 72   Temp 98.7 F (37.1 C) (Oral)   Ht '5\' 3"'$  (1.6 m)   Wt 192 lb 6.4 oz (87.3 kg)   LMP 11/15/2022 (Exact Date)   SpO2 100%   BMI 34.08 kg/m   Physical Exam Vitals and nursing note reviewed.  Constitutional:      Appearance: Normal appearance.  Pulmonary:     Effort: Pulmonary effort is normal.  Musculoskeletal:        General: Normal range of motion.     Cervical back: Normal range of motion.     Comments: Normal gait. Normal coordination. ROM normal. 5/5 upper and lower extremity strength. Negative straight leg raise. No bony spinous process tenderness.     Skin:    General: Skin is warm and dry.  Neurological:     Mental Status: She is alert.         Assessment & Plan:   Problem List Items Addressed This Visit     Anxiety and depression - Primary    PHQ-9 total score: 20. She endorses having thoughts of self-harm, but no plan to go through with it. 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 Interested in starting on medication and seeing psychiatry. Will start on sertraline 25 mg QD with close follow-up. She will return in one month, sooner if anything changes. Information provided on [facility] services. Encouraged her to seek trusted friend/family member to keep an eye on her while she starts this new medication. Understands it will take 8-12 weeks for medication to take full effect with possibility of having increased thoughts of self harm.  [facility] [place], [place]  No appointment necessary  Open 24/7.  Relevant Medications   sertraline (ZOLOFT) 25 MG tablet   Other Relevant Orders   POCT urine pregnancy (Completed)   Ambulatory referral to [facility]  Urine pregnancy negative.  Back assessment normal in office. Likely stress related.  Agrees with plan of care discussed.  Questions answered. Information provided for immediate mental health assessment:   Return in about 4 weeks (around 01/06/2023) for depression, sooner if anything changes.   Chalmers Guest, FNP

## 2022-12-09 NOTE — Assessment & Plan Note (Addendum)
PHQ-9 total score: 20. She endorses having thoughts of self-harm, but no plan to go through with it. 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 Interested in starting on medication and seeing psychiatry. Will start on sertraline 25 mg QD with close follow-up. She will return in one month, sooner if anything changes. Information provided on [facility] services. Encouraged her to seek trusted friend/family member to keep an eye on her while she starts this new medication. Understands it will take 8-12 weeks for medication to take full effect with possibility of having increased thoughts of self harm.  [facility] [place], [place]  No appointment necessary  Open 24/7.

## 2022-12-10 ENCOUNTER — Ambulatory Visit: Payer: Federal, State, Local not specified - PPO | Admitting: Family Medicine

## 2022-12-31 ENCOUNTER — Other Ambulatory Visit: Payer: Self-pay | Admitting: Family Medicine

## 2022-12-31 DIAGNOSIS — F32A Anxiety disorder, unspecified: Secondary | ICD-10-CM

## 2023-01-06 ENCOUNTER — Ambulatory Visit: Payer: Federal, State, Local not specified - PPO | Admitting: Family Medicine

## 2023-01-30 ENCOUNTER — Ambulatory Visit: Payer: Federal, State, Local not specified - PPO | Admitting: Family Medicine

## 2023-03-12 ENCOUNTER — Ambulatory Visit (INDEPENDENT_AMBULATORY_CARE_PROVIDER_SITE_OTHER): Payer: Federal, State, Local not specified - PPO | Admitting: Family Medicine

## 2023-03-12 ENCOUNTER — Encounter: Payer: Self-pay | Admitting: Family Medicine

## 2023-03-12 VITALS — BP 128/73 | HR 61 | Temp 98.7°F | Ht 63.0 in | Wt 186.6 lb

## 2023-03-12 DIAGNOSIS — Z7251 High risk heterosexual behavior: Secondary | ICD-10-CM

## 2023-03-12 DIAGNOSIS — Z1159 Encounter for screening for other viral diseases: Secondary | ICD-10-CM | POA: Insufficient documentation

## 2023-03-12 DIAGNOSIS — Z0000 Encounter for general adult medical examination without abnormal findings: Secondary | ICD-10-CM | POA: Diagnosis not present

## 2023-03-12 DIAGNOSIS — Z130 Encounter for screening for diseases of the blood and blood-forming organs and certain disorders involving the immune mechanism: Secondary | ICD-10-CM | POA: Insufficient documentation

## 2023-03-12 DIAGNOSIS — N898 Other specified noninflammatory disorders of vagina: Secondary | ICD-10-CM | POA: Diagnosis not present

## 2023-03-12 DIAGNOSIS — Z0289 Encounter for other administrative examinations: Secondary | ICD-10-CM | POA: Insufficient documentation

## 2023-03-12 DIAGNOSIS — Z13228 Encounter for screening for other metabolic disorders: Secondary | ICD-10-CM

## 2023-03-12 DIAGNOSIS — Z13220 Encounter for screening for lipoid disorders: Secondary | ICD-10-CM | POA: Insufficient documentation

## 2023-03-12 DIAGNOSIS — Z113 Encounter for screening for infections with a predominantly sexual mode of transmission: Secondary | ICD-10-CM | POA: Diagnosis not present

## 2023-03-12 DIAGNOSIS — Z6833 Body mass index (BMI) 33.0-33.9, adult: Secondary | ICD-10-CM | POA: Insufficient documentation

## 2023-03-12 NOTE — Progress Notes (Signed)
Complete physical exam  Patient: Danielle Arroyo   DOB: [birth year]   26 y.o. Female  MRN: [id number]  Subjective:    Chief Complaint  Patient presents with   Annual Exam    Pt is fasting. Last Hep C 09/17/21, Non reactive.    Vaginal Discharge    Started 1 day before her cycle 1 weeks ago. She complains of a mild odor, but no itching. She has used boric acid suppositories, but has not helped symptoms.     Danielle Arroyo is a 27 y.o. female who presents today for a complete physical exam. She reports consuming a general diet. The patient does not participate in regular exercise at present. She generally feels well. She reports sleeping well. She does have additional problems to discuss today.   Depression:  Went off of sertraline about one week ago and feels good. Feels like her depression was related to environment and work schedule. Does not want to continue with medication. PHQ-9 today: 3.   Vaginal discharge:  Mild odor, has unprotected intercourse. Will send swab for BV/candida,  urine for STD's, and blood work for STDs.    Most recent fall risk assessment:    12/09/2022    2:35 PM  Fall Risk   Falls in the past year? 0  Number falls in past yr: 0  Injury with Fall? 0  Risk for fall due to : No Fall Risks  Follow up Falls evaluation completed     Most recent depression screenings:    03/12/2023   12:00 PM 12/09/2022    2:34 PM  PHQ 2/9 Scores  PHQ - 2 Score 1 6  PHQ- 9 Score 3 20  Feels much better. Has made positive changes in environment and work schedule. No thoughts of self-harm   Vision:Not within last year  and Dental: No current dental problems and No regular dental care     Patient Care Team: Novella Olive, FNP as PCP - General (Family Medicine)   Outpatient Medications Prior to Visit  Medication Sig   Boric Acid Vaginal 600 MG SUPP insert 1 supp in vag qhs x 2 wks then 1 supp every Mon / Thur pm for 2-3 monthths   etonogestrel-ethinyl estradiol (NUVARING)  0.12-0.015 MG/24HR vaginal ring  (Patient not taking: Reported on 12/09/2022)   medroxyPROGESTERone (DEPO-PROVERA) 150 MG/ML injection Inject 150 mg into the muscle every 3 (three) months. (Patient not taking: Reported on 12/09/2022)   methocarbamol (ROBAXIN) 500 MG tablet Take 1 tablet (500 mg total) by mouth every 8 (eight) hours as needed for muscle spasms. (Patient not taking: Reported on 12/09/2022)   ondansetron (ZOFRAN ODT) 4 MG disintegrating tablet Take 1 tablet (4 mg total) by mouth every 8 (eight) hours as needed for nausea or vomiting. (Patient not taking: Reported on 12/09/2022)   sertraline (ZOLOFT) 25 MG tablet TAKE 1 TABLET (25 MG TOTAL) BY MOUTH DAILY.   valACYclovir (VALTREX) 1000 MG tablet Take 1,000 mg by mouth 2 (two) times daily.   No facility-administered medications prior to visit.    Review of Systems  Constitutional:  Negative for chills and fever.  Eyes:  Negative for blurred vision and double vision.  Respiratory:  Negative for shortness of breath.   Cardiovascular:  Negative for chest pain.  Gastrointestinal:  Negative for abdominal pain, nausea and vomiting.  Musculoskeletal:  Negative for back pain and neck pain.  Neurological:  Negative for dizziness and headaches.  Psychiatric/Behavioral:  Negative for depression and suicidal ideas.  Objective:     BP 128/73   Pulse 61   Temp 98.7 F (37.1 C) (Oral)   Ht 5\' 3"  (1.6 m)   Wt 186 lb 9.6 oz (84.6 kg)   LMP 03/03/2023   SpO2 100%   BMI 33.05 kg/m  BP Readings from Last 3 Encounters:  03/12/23 128/73  12/09/22 124/87  07/22/21 128/68      Physical Exam Vitals and nursing note reviewed.  Constitutional:      General: She is not in acute distress.    Appearance: Normal appearance. She is obese.  HENT:     Right Ear: Tympanic membrane normal.     Left Ear: Tympanic membrane normal.     Nose: Nose normal.     Mouth/Throat:     Mouth: Mucous membranes are moist.     Pharynx: Oropharynx is  clear.  Neck:     Thyroid: No thyroid tenderness.  Cardiovascular:     Rate and Rhythm: Normal rate and regular rhythm.     Pulses: Normal pulses.     Heart sounds: Normal heart sounds.  Pulmonary:     Effort: Pulmonary effort is normal.     Breath sounds: Normal breath sounds.  Abdominal:     General: Bowel sounds are normal.     Palpations: Abdomen is soft.     Tenderness: There is no abdominal tenderness.  Genitourinary:    Vagina: Vaginal discharge (reports) present.     Comments: Deferred, will do self-swab.  Musculoskeletal:     Cervical back: Normal range of motion.     Right lower leg: No edema.     Left lower leg: No edema.  Lymphadenopathy:     Cervical:     Right cervical: No superficial cervical adenopathy.    Left cervical: No superficial cervical adenopathy.  Skin:    General: Skin is warm and dry.     Capillary Refill: Capillary refill takes less than 2 seconds.  Neurological:     General: No focal deficit present.     Mental Status: She is alert. Mental status is at baseline.  Psychiatric:        Mood and Affect: Mood normal.        Behavior: Behavior normal.        Thought Content: Thought content normal.        Judgment: Judgment normal.      No results found for any visits on 03/12/23.     Assessment & Plan:    Routine Health Maintenance and Physical Exam   There is no immunization history on file for this patient.  Health Maintenance  Topic Date Due   Hepatitis C Screening  Never done   DTaP/Tdap/Td (1 - Tdap) Never done   PAP SMEAR-Modifier  01/27/2024 (Originally 05/09/2017)   HPV VACCINES (1 - 2-dose series) 03/11/2024 (Originally 05/10/2007)   PAP-Cervical Cytology Screening  01/27/2024   HIV Screening  Completed   INFLUENZA VACCINE  Discontinued   COVID-19 Vaccine  Discontinued    Discussed health benefits of physical activity, and encouraged her to engage in regular exercise appropriate for her age and condition.  Annual physical  exam -     CBC with Differential/Platelet -     Comprehensive metabolic panel  Vaginal discharge -     NuSwab BV and Candida, NAA -     Chlamydia/Gonococcus/Trichomonas, NAA  Encounter for lipid screening for cardiovascular disease -     Lipid panel  Need for hepatitis C  screening test -     Hepatitis C antibody  Encounter for screening for metabolic disorder -     Comprehensive metabolic panel -     Hemoglobin A1c -     TSH + free T4  BMI 33.0-33.9,adult -     Comprehensive metabolic panel -     Hemoglobin A1c -     TSH + free T4  Screening examination for sexually transmitted disease -     Chlamydia/Gonococcus/Trichomonas, NAA  Screening for viral disease -     HIV Antibody (routine testing w rflx) -     RPR  Unprotected sexual intercourse -     Chlamydia/Gonococcus/Trichomonas, NAA -     HIV Antibody (routine testing w rflx) -     RPR    Routine labs ordered.  HCM reviewed/discussed. Pap smear due 2025. Anticipatory guidance regarding healthy weight, lifestyle and choices given. Recommend healthy diet.  Recommend approximately 150 minutes/week of moderate intensity exercise. Resistance training is good for building muscles and for bone health. Muscle mass helps to increase our metabolism and to burn more calories at rest.  Limit alcohol consumption: no more than one drink per day for women and 2 drinks per day for me. Recommend regular dental and vision exams. Always use seatbelt/lap and shoulder restraints. Recommend using smoke alarms and checking batteries at least twice a year. Recommend using sunscreen when outside.  Please know that I am here to help you with all of your health care goals and am happy to work with you to find a solution that works best for you.  The greatest advice I have received with any changes in life are to take it one step at a time, that even means if all you can focus on is the next 60 seconds, then do that and celebrate your victories.   With any changes in life, you will have set backs, and that is OK. The important thing to remember is, if you have a set back, it is not a failure, it is an opportunity to try again! Agrees with plan of care discussed.  Questions answered.      Return in about 1 year (around 03/11/2024) for cpe with pap and labs .     Novella Olive, FNP

## 2023-03-13 LAB — COMPREHENSIVE METABOLIC PANEL
ALT: 10 IU/L (ref 0–32)
AST: 8 IU/L (ref 0–40)
Albumin/Globulin Ratio: 1.6 (ref 1.2–2.2)
Albumin: 4.6 g/dL (ref 4.0–5.0)
Alkaline Phosphatase: 71 IU/L (ref 44–121)
BUN/Creatinine Ratio: 23 (ref 9–23)
BUN: 15 mg/dL (ref 6–20)
Bilirubin Total: 0.4 mg/dL (ref 0.0–1.2)
CO2: 23 mmol/L (ref 20–29)
Calcium: 9.5 mg/dL (ref 8.7–10.2)
Chloride: 103 mmol/L (ref 96–106)
Creatinine, Ser: 0.64 mg/dL (ref 0.57–1.00)
Globulin, Total: 2.8 g/dL (ref 1.5–4.5)
Glucose: 84 mg/dL (ref 70–99)
Potassium: 4.2 mmol/L (ref 3.5–5.2)
Sodium: 138 mmol/L (ref 134–144)
Total Protein: 7.4 g/dL (ref 6.0–8.5)
eGFR: 125 mL/min/{1.73_m2} (ref >59)

## 2023-03-13 LAB — CBC WITH DIFFERENTIAL/PLATELET
Basophils Absolute: 0 10*3/uL (ref 0.0–0.2)
Basos: 1 %
EOS (ABSOLUTE): 0.1 10*3/uL (ref 0.0–0.4)
Eos: 1 %
Hematocrit: 42.4 % (ref 34.0–46.6)
Hemoglobin: 14.5 g/dL (ref 11.1–15.9)
Immature Grans (Abs): 0 10*3/uL (ref 0.0–0.1)
Immature Granulocytes: 0 %
Lymphocytes Absolute: 1.3 10*3/uL (ref 0.7–3.1)
Lymphs: 23 %
MCH: 32.2 pg (ref 26.6–33.0)
MCHC: 34.2 g/dL (ref 31.5–35.7)
MCV: 94 fL (ref 79–97)
Monocytes Absolute: 0.4 10*3/uL (ref 0.1–0.9)
Monocytes: 7 %
Neutrophils Absolute: 3.9 10*3/uL (ref 1.4–7.0)
Neutrophils: 68 %
Platelets: 413 10*3/uL (ref 150–450)
RBC: 4.51 x10E6/uL (ref 3.77–5.28)
RDW: 12.6 % (ref 11.7–15.4)
WBC: 5.7 10*3/uL (ref 3.4–10.8)

## 2023-03-13 LAB — RPR: RPR Ser Ql: NONREACTIVE

## 2023-03-13 LAB — TSH+FREE T4
Free T4: 1.16 ng/dL (ref 0.82–1.77)
TSH: 1.02 u[IU]/mL (ref 0.450–4.500)

## 2023-03-13 LAB — LIPID PANEL
Chol/HDL Ratio: 2.8 ratio (ref 0.0–4.4)
Cholesterol, Total: 148 mg/dL (ref 100–199)
HDL: 53 mg/dL (ref >39)
LDL Chol Calc (NIH): 84 mg/dL (ref 0–99)
Triglycerides: 49 mg/dL (ref 0–149)
VLDL Cholesterol Cal: 11 mg/dL (ref 5–40)

## 2023-03-13 LAB — HIV ANTIBODY (ROUTINE TESTING W REFLEX): HIV Screen 4th Generation wRfx: NONREACTIVE

## 2023-03-13 LAB — HEMOGLOBIN A1C
Est. average glucose Bld gHb Est-mCnc: 114 mg/dL
Hgb A1c MFr Bld: 5.6 % (ref 4.8–5.6)

## 2023-03-13 LAB — HEPATITIS C ANTIBODY: Hep C Virus Ab: NONREACTIVE

## 2023-03-14 LAB — CHLAMYDIA/GONOCOCCUS/TRICHOMONAS, NAA
Chlamydia by NAA: NEGATIVE
Gonococcus by NAA: NEGATIVE
Trich vag by NAA: NEGATIVE

## 2023-03-15 LAB — NUSWAB BV AND CANDIDA, NAA
Atopobium vaginae: HIGH Score — AB
BVAB 2: HIGH Score — AB
Candida albicans, NAA: NEGATIVE
Candida glabrata, NAA: NEGATIVE
Megasphaera 1: HIGH Score — AB

## 2023-03-17 ENCOUNTER — Encounter: Payer: Self-pay | Admitting: Family Medicine

## 2023-03-17 ENCOUNTER — Other Ambulatory Visit: Payer: Self-pay | Admitting: Family Medicine

## 2023-03-17 DIAGNOSIS — B9689 Other specified bacterial agents as the cause of diseases classified elsewhere: Secondary | ICD-10-CM

## 2023-03-17 MED ORDER — CLINDAMYCIN HCL 300 MG PO CAPS
300.0000 mg | ORAL_CAPSULE | Freq: Two times a day (BID) | ORAL | 0 refills | Status: DC
Start: 2023-03-17 — End: 2023-11-12

## 2023-03-17 NOTE — Progress Notes (Signed)
Prescription sent for BV infection.

## 2023-03-18 ENCOUNTER — Telehealth: Payer: Self-pay

## 2023-03-18 NOTE — Telephone Encounter (Signed)
LVM for patient informing her that her medication was sent to [facility] (Pharmacy) in patients chart

## 2023-04-03 ENCOUNTER — Other Ambulatory Visit: Payer: Self-pay | Admitting: Family Medicine

## 2023-04-03 ENCOUNTER — Telehealth: Payer: Self-pay

## 2023-04-03 DIAGNOSIS — N760 Acute vaginitis: Secondary | ICD-10-CM

## 2023-04-03 MED ORDER — METRONIDAZOLE 0.75 % VA GEL
1.0000 | Freq: Two times a day (BID) | VAGINAL | 0 refills | Status: DC
Start: 2023-04-03 — End: 2023-06-30

## 2023-04-03 NOTE — Telephone Encounter (Signed)
Spoke with patient and she states that the medication that she took was in pill form she said she was willing to try the Gel form.  She would like the medication sent to [id number], [place] [address].  Pharmacy is in chart.

## 2023-05-07 ENCOUNTER — Ambulatory Visit: Payer: Federal, State, Local not specified - PPO | Admitting: Family Medicine

## 2023-05-07 ENCOUNTER — Encounter: Payer: Self-pay | Admitting: Family Medicine

## 2023-05-07 VITALS — BP 112/75 | HR 105 | Temp 100.7°F | Resp 18 | Ht 63.0 in | Wt 187.0 lb

## 2023-05-07 DIAGNOSIS — R52 Pain, unspecified: Secondary | ICD-10-CM

## 2023-05-07 DIAGNOSIS — J029 Acute pharyngitis, unspecified: Secondary | ICD-10-CM

## 2023-05-07 DIAGNOSIS — U071 COVID-19: Secondary | ICD-10-CM

## 2023-05-07 LAB — POCT RAPID STREP A (OFFICE): Rapid Strep A Screen: NEGATIVE

## 2023-05-07 LAB — POC COVID19 BINAXNOW: SARS Coronavirus 2 Ag: POSITIVE — AB

## 2023-05-07 MED ORDER — IBUPROFEN 800 MG PO TABS
800.0000 mg | ORAL_TABLET | Freq: Three times a day (TID) | ORAL | 0 refills | Status: DC | PRN
Start: 2023-05-07 — End: 2024-06-16

## 2023-05-07 NOTE — Patient Instructions (Addendum)
The rapid strep test performed was negative. It is likely that your sore throat is caused by a virus. Warm fluids such as tea/honey and warm salt water gargles may help with the discomfort.  Symptomatic and supportive therapy such as OTC topical throat spray such as chloraseptic spray or throat lozenges are recommended. Acetaminophen or ibuprofen according to package instructions for pain. Adequate hydration and rest.    Viral illnesses can take up to 10 days to resolve.   There is no role for an antibiotic.  Symptomatic treatment is ideal.   Take over the counter pain medication as needed. Acetaminophen and ibuprofen for fever and body aches. Mucinex and Robitussin for cough, if you have high blood pressure, take Coricidin for cough. Honey is also effective for cough, avoid if diabetic. Flonase or saline nasal spray for nasal congestion.    Read and follow instructions on the label and make sure not to combine other medications that may have same ingredients in it. It is important to not take too much.    Drink plenty of caffeine-free fluids. (If you have heart or kidney problems, follow the instructions of your specialist regarding amounts). Adequate fluids will help you to avoid dehydration.   If you are hungry, eat a bland diet, such as the BRAT diet (bananas, rice, applesauce, toast). Diet as tolerated if appetite is normal.   Get lots of rest.  Let us know if you are not improving or getting worse.

## 2023-05-07 NOTE — Progress Notes (Signed)
Established Patient Office Visit  Subjective   Patient ID: Danielle Arroyo, female    DOB: April 12, 1996  Age: 27 y.o. MRN: [id number]  Chief Complaint  Patient presents with   Fever    Patient is here due to a fever, body aches, sore throat, and headaches , Patient states that she was traveled to [place] and returned about 1 day ago she states that her symptoms started with a sore throat and headache, and she said this morning she started having body aches. Today at Office visit she presented with a fever of 100.7    HPI Presents today for an acute visit with complaint of sore throat, headache, body aches  Symptoms have been present  one day  Associated symptoms include: fever and chillls Pertinent negatives: no chest pain or shortness of breath, no cough  Pain severity: 0/10  Treatments tried include : lozenge Treatment effective : yes Sick contacts : traveling, has been on airplane    Review of Systems  Constitutional:  Positive for chills and fever.  HENT:  Positive for sore throat.   Respiratory:  Negative for shortness of breath.   Cardiovascular:  Negative for chest pain.  Musculoskeletal:  Positive for myalgias.  Neurological:  Positive for headaches.      Objective:     BP 112/75   Pulse (!) 105   Temp (!) 100.7 F (38.2 C) (Oral)   Resp 18   Ht 5\' 3"  (1.6 m)   Wt 187 lb (84.8 kg)   LMP 04/23/2023 (Exact Date)   SpO2 99%   BMI 33.13 kg/m    Physical Exam Vitals and nursing note reviewed.  Constitutional:      General: She is not in acute distress.    Appearance: Normal appearance. She is ill-appearing.  HENT:     Right Ear: Tympanic membrane normal.     Left Ear: Tympanic membrane normal.     Nose: Nose normal.     Mouth/Throat:     Mouth: Mucous membranes are moist.     Pharynx: Posterior oropharyngeal erythema present. No oropharyngeal exudate.  Cardiovascular:     Rate and Rhythm: Normal rate and regular rhythm.     Heart sounds: Normal heart  sounds.  Pulmonary:     Effort: Pulmonary effort is normal.     Breath sounds: Normal breath sounds.  Skin:    General: Skin is warm and dry.  Neurological:     General: No focal deficit present.     Mental Status: She is alert. Mental status is at baseline.  Psychiatric:        Mood and Affect: Mood normal.        Behavior: Behavior normal.        Thought Content: Thought content normal.        Judgment: Judgment normal.     Results for orders placed or performed in visit on 05/07/23  POCT rapid strep A  Result Value Ref Range   Rapid Strep A Screen Negative Negative  POC COVID-19 BinaxNow  Result Value Ref Range   SARS Coronavirus 2 Ag Positive (A) Negative      The ASCVD Risk score (Arnett DK, et al., 2019) failed to calculate for the following reasons:   The 2019 ASCVD risk score is only valid for ages 77 to 64    Assessment & Plan:   Problem List Items Addressed This Visit     Generalized body aches   Relevant Medications   ibuprofen (  ADVIL) 800 MG tablet   Other Relevant Orders   POC COVID-19 BinaxNow (Completed)   Sore throat   Relevant Medications   ibuprofen (ADVIL) 800 MG tablet   Other Relevant Orders   POCT rapid strep A (Completed)   COVID-19 virus infection - Primary   Relevant Medications   ibuprofen (ADVIL) 800 MG tablet  Rapid Covid positive. Lungs clear. No shortness of breath. Oxygen saturation 99%.  Ibuprofen 800 mg Q 8 hours for body aches and fever. Supportive therapy as instructed.  No indication for anti-viral therapy.  Symptoms reviewed that warrant seeking higher level of care. Work note provided, may return 05/12/23. Agrees with plan of care discussed.  Questions answered.  Return if symptoms worsen or fail to improve.    Novella Olive, FNP

## 2023-05-19 ENCOUNTER — Other Ambulatory Visit: Payer: Self-pay | Admitting: Family Medicine

## 2023-05-19 DIAGNOSIS — B009 Herpesviral infection, unspecified: Secondary | ICD-10-CM

## 2023-05-19 MED ORDER — VALACYCLOVIR HCL 500 MG PO TABS
500.0000 mg | ORAL_TABLET | Freq: Two times a day (BID) | ORAL | 1 refills | Status: DC
Start: 2023-05-19 — End: 2024-05-14

## 2023-05-29 ENCOUNTER — Other Ambulatory Visit: Payer: Self-pay | Admitting: Family Medicine

## 2023-05-29 DIAGNOSIS — B009 Herpesviral infection, unspecified: Secondary | ICD-10-CM

## 2023-06-30 ENCOUNTER — Other Ambulatory Visit: Payer: Self-pay | Admitting: Family Medicine

## 2023-06-30 DIAGNOSIS — B9689 Other specified bacterial agents as the cause of diseases classified elsewhere: Secondary | ICD-10-CM

## 2023-06-30 MED ORDER — METRONIDAZOLE 0.75 % VA GEL
1.0000 | Freq: Two times a day (BID) | VAGINAL | 0 refills | Status: DC
Start: 2023-06-30 — End: 2023-11-12

## 2023-07-03 ENCOUNTER — Other Ambulatory Visit: Payer: Self-pay | Admitting: Family Medicine

## 2023-07-03 DIAGNOSIS — N760 Acute vaginitis: Secondary | ICD-10-CM

## 2023-09-18 ENCOUNTER — Other Ambulatory Visit: Payer: Self-pay | Admitting: Family Medicine

## 2023-09-18 ENCOUNTER — Encounter: Payer: Self-pay | Admitting: Family Medicine

## 2023-09-18 ENCOUNTER — Ambulatory Visit (INDEPENDENT_AMBULATORY_CARE_PROVIDER_SITE_OTHER): Payer: Federal, State, Local not specified - PPO | Admitting: Family Medicine

## 2023-09-18 ENCOUNTER — Ambulatory Visit: Payer: Federal, State, Local not specified - PPO | Admitting: Family Medicine

## 2023-09-18 VITALS — BP 126/82 | HR 72 | Temp 97.7°F | Resp 16 | Ht 63.0 in | Wt 201.0 lb

## 2023-09-18 DIAGNOSIS — R300 Dysuria: Secondary | ICD-10-CM | POA: Diagnosis not present

## 2023-09-18 DIAGNOSIS — N3000 Acute cystitis without hematuria: Secondary | ICD-10-CM

## 2023-09-18 DIAGNOSIS — Z7251 High risk heterosexual behavior: Secondary | ICD-10-CM | POA: Diagnosis not present

## 2023-09-18 LAB — POCT URINALYSIS DIP (CLINITEK)
Bilirubin, UA: NEGATIVE
Blood, UA: NEGATIVE
Glucose, UA: NEGATIVE mg/dL
Ketones, POC UA: NEGATIVE mg/dL
Nitrite, UA: POSITIVE — AB
POC PROTEIN,UA: NEGATIVE
Spec Grav, UA: >=1.03 — AB (ref 1.010–1.025)
Urobilinogen, UA: 0.2 U/dL
pH, UA: 6 (ref 5.0–8.0)

## 2023-09-18 MED ORDER — NITROFURANTOIN MONOHYD MACRO 100 MG PO CAPS: 100.0000 mg | ORAL_CAPSULE | Freq: Two times a day (BID) | ORAL | 0 refills | Status: DC

## 2023-09-18 NOTE — Assessment & Plan Note (Signed)
Urinary urgency and frequency for 3 days. No fever, chills, or flank pain. Urine dip positive for nitrites and leukocytes. Macrobid 100 mg BID x 5 days. Will send urine for culture. Increase water.

## 2023-09-18 NOTE — Assessment & Plan Note (Signed)
Strongly encouraged condom use every time. Labs ordered today to assess for STDs. Having "clumpy" white vaginal discharge. No known exposure, one sexual partner.

## 2023-09-18 NOTE — Patient Instructions (Signed)
You were prescribed an antibiotic today. Please complete the full course.  Acetaminophen or ibuprofen for fever according to package instructions, do not exceed recommended doses.  Adequate fluids to avoid dehydration. Lots of rest while you are recovering.  Follow-up if your symptoms do not improve.     

## 2023-09-18 NOTE — Progress Notes (Signed)
Established Patient Office Visit  Subjective   Patient ID: Danielle Arroyo, female    DOB: 06-17-1996  Age: 27 y.o. MRN: [id number]  Chief Complaint  Patient presents with   Dysuria    Symptoms: frequent urination and pressure , odor and discomfort, pt would  also like  sti testing. On set 3 days    HPI  Urinary symptoms:  urgency and frequency with odor. Urinary symptoms present for three days. No fever, chills, or flank pain.  Vaginal symptoms: clumpy white discharge. Present for one week.  Unprotected intercourse: one partner.  Will get GC/chlamydia swab. Wants to get blood work today. No known exposures.    Review of Systems  Constitutional:  Negative for chills and fever.  Genitourinary:  Positive for dysuria, frequency and urgency. Negative for flank pain and hematuria.      Objective:     BP 126/82   Pulse 72   Temp 97.7 F (36.5 C) (Oral)   Resp 16   Ht 5\' 3"  (1.6 m)   Wt 201 lb (91.2 kg)   SpO2 100%   BMI 35.61 kg/m     Physical Exam Vitals and nursing note reviewed.  Constitutional:      General: She is not in acute distress.    Appearance: Normal appearance.  Cardiovascular:     Rate and Rhythm: Regular rhythm.     Heart sounds: Normal heart sounds.  Pulmonary:     Effort: Pulmonary effort is normal.     Breath sounds: Normal breath sounds.  Abdominal:     Tenderness: There is no right CVA tenderness or left CVA tenderness.  Skin:    General: Skin is warm and dry.  Neurological:     General: No focal deficit present.     Mental Status: She is alert. Mental status is at baseline.  Psychiatric:        Mood and Affect: Mood normal.        Behavior: Behavior normal.        Thought Content: Thought content normal.        Judgment: Judgment normal.      Results for orders placed or performed in visit on 09/18/23  POCT URINALYSIS DIP (CLINITEK)  Result Value Ref Range   Color, UA straw (A) yellow   Clarity, UA cloudy (A) clear   Glucose,  UA negative negative mg/dL   Bilirubin, UA negative negative   Ketones, POC UA negative negative mg/dL   Spec Grav, UA >=7.829 (A) 1.010 - 1.025   Blood, UA negative negative   pH, UA 6.0 5.0 - 8.0   POC PROTEIN,UA negative negative, trace   Urobilinogen, UA 0.2 0.2 or 1.0 E.U./dL   Nitrite, UA Positive (A) Negative   Leukocytes, UA Trace (A) Negative      The ASCVD Risk score (Arnett DK, et al., 2019) failed to calculate for the following reasons:   The 2019 ASCVD risk score is only valid for ages 79 to 65    Assessment & Plan:   Problem List Items Addressed This Visit     Unprotected sexual intercourse   Strongly encouraged condom use every time. Labs ordered today to assess for STDs. Having "clumpy" white vaginal discharge. No known exposure, one sexual partner.       Relevant Orders   GC/Chlamydia Probe Amp([facility])   WET PREP FOR TRICH, YEAST, CLUE   Beta hCG quant (ref lab)   Hepatitis C antibody   HIV Antibody (routine testing  w rflx)   RPR   Dysuria   Relevant Orders   Urine Culture   POCT URINALYSIS DIP (CLINITEK) (Completed)   Acute cystitis without hematuria - Primary   Urinary urgency and frequency for 3 days. No fever, chills, or flank pain. Urine dip positive for nitrites and leukocytes. Macrobid 100 mg BID x 5 days. Will send urine for culture. Increase water.       Relevant Medications   nitrofurantoin, macrocrystal-monohydrate, (MACROBID) 100 MG capsule  Agrees with plan of care discussed.  Questions answered.   Return if symptoms worsen or fail to improve.    Novella Olive, FNP

## 2023-09-19 LAB — RPR: RPR Ser Ql: NONREACTIVE

## 2023-09-19 LAB — HEPATITIS C ANTIBODY: Hep C Virus Ab: NONREACTIVE

## 2023-09-19 LAB — HIV ANTIBODY (ROUTINE TESTING W REFLEX): HIV Screen 4th Generation wRfx: NONREACTIVE

## 2023-09-19 LAB — BETA HCG QUANT (REF LAB): hCG Quant: <1 m[IU]/mL

## 2023-09-21 LAB — GC/CHLAMYDIA PROBE AMP
Chlamydia trachomatis, NAA: NEGATIVE
Neisseria Gonorrhoeae by PCR: NEGATIVE

## 2023-10-30 ENCOUNTER — Ambulatory Visit: Payer: Federal, State, Local not specified - PPO | Admitting: Family Medicine

## 2023-11-11 ENCOUNTER — Ambulatory Visit: Payer: Federal, State, Local not specified - PPO | Admitting: Family Medicine

## 2023-11-12 ENCOUNTER — Ambulatory Visit (INDEPENDENT_AMBULATORY_CARE_PROVIDER_SITE_OTHER): Payer: Federal, State, Local not specified - PPO | Admitting: Family Medicine

## 2023-11-12 ENCOUNTER — Encounter: Payer: Self-pay | Admitting: Family Medicine

## 2023-11-12 VITALS — BP 119/74 | HR 71 | Temp 98.1°F | Resp 18 | Ht 63.0 in | Wt 199.2 lb

## 2023-11-12 DIAGNOSIS — Z01812 Encounter for preprocedural laboratory examination: Secondary | ICD-10-CM | POA: Diagnosis not present

## 2023-11-12 DIAGNOSIS — Z1329 Encounter for screening for other suspected endocrine disorder: Secondary | ICD-10-CM

## 2023-11-12 DIAGNOSIS — Z13228 Encounter for screening for other metabolic disorders: Secondary | ICD-10-CM

## 2023-11-12 DIAGNOSIS — Z1159 Encounter for screening for other viral diseases: Secondary | ICD-10-CM

## 2023-11-12 DIAGNOSIS — Z130 Encounter for screening for diseases of the blood and blood-forming organs and certain disorders involving the immune mechanism: Secondary | ICD-10-CM | POA: Diagnosis not present

## 2023-11-12 NOTE — Progress Notes (Signed)
 Established Patient Office Visit  Subjective   Patient ID: Nila Winker, female    DOB: 09-22-1996  Age: 28 y.o. MRN: [id number]  Chief Complaint  Patient presents with   Paperwork    Patient is here to have paperwork filled out for surgery    HPI  Having cosmetic surgery in [place] . Has paperwork and labs that need to be done.  Surgery scheduled 12/05/23.  Liposuction 360. Surgery in [place] .  Recovery in [place] 5 days. Explained possibility of surgical complications.   Review of Systems  Constitutional:  Negative for chills and fever.  Respiratory:  Negative for cough, shortness of breath and wheezing.   Cardiovascular:  Negative for chest pain, palpitations and leg swelling.  Gastrointestinal:  Negative for abdominal pain, nausea and vomiting.  Genitourinary:  Negative for dysuria, flank pain, frequency and urgency.  Musculoskeletal:  Negative for joint pain.  Neurological:  Negative for dizziness, weakness and headaches.      Objective:     BP 119/74   Pulse 71   Temp 98.1 F (36.7 C) (Oral)   Resp 18   Ht 5' 3 (1.6 m)   Wt 199 lb 3.2 oz (90.4 kg)   SpO2 100%   BMI 35.29 kg/m    Physical Exam Vitals and nursing note reviewed.  Constitutional:      General: She is not in acute distress.    Appearance: Normal appearance. She is obese.  HENT:     Right Ear: Tympanic membrane normal.     Left Ear: Tympanic membrane normal.  Neck:     Thyroid: No thyroid mass, thyromegaly or thyroid tenderness.  Cardiovascular:     Rate and Rhythm: Normal rate and regular rhythm.     Heart sounds: Normal heart sounds.  Pulmonary:     Effort: Pulmonary effort is normal.     Breath sounds: Normal breath sounds.  Abdominal:     General: Bowel sounds are normal.     Tenderness: There is no abdominal tenderness.  Musculoskeletal:        General: Normal range of motion.  Lymphadenopathy:     Cervical:     Right cervical: No superficial cervical adenopathy.    Left  cervical: No superficial cervical adenopathy.  Skin:    General: Skin is warm and dry.  Neurological:     General: No focal deficit present.     Mental Status: She is alert. Mental status is at baseline.  Psychiatric:        Mood and Affect: Mood normal.        Behavior: Behavior normal.        Thought Content: Thought content normal.   EKG: SB, no acute ST elevation. No chest pain or shortness of breath. No known cardiac history.   No results found for any visits on 11/12/23.    The ASCVD Risk score (Arnett DK, et al., 2019) failed to calculate for the following reasons:   The 2019 ASCVD risk score is only valid for ages 71 to 34    Assessment & Plan:   Pre-operative laboratory examination  Discussed pre-op surgical clearance based on lab findings. Understands surgical complications can occur regardless of history. Encouraged Jo-Anne to stay mobile after surgery. Paperwork will be faxed to surgeon once results are back.   -     Urinalysis, Routine w reflex microscopic -     Hgb A1c w/o eAG -     CBC with Differential/Platelet -  Comprehensive metabolic panel -     hCG, serum, qualitative -     hCG, quantitative, pregnancy; Future -     HIV Antibody (routine testing w rflx) -     PT and PTT -     TSH+T4F+T3Free -     Sickle cell screen -     EKG 12-Lead  Screening for disorder of blood and blood-forming organs -     PT and PTT -     Sickle cell screen  Encounter for screening for metabolic disorder -     Hgb A1c w/o eAG -     Comprehensive metabolic panel  Screening for thyroid disorder -     TSH+T4F+T3Free  Screening for viral disease -     HIV Antibody (routine testing w rflx)  Screening for deficiency anemia -     CBC with Differential/Platelet      Return if symptoms worsen or fail to improve.    Total time face to face: 30 minutes ordering test, performing EKG, discussing personal and family history, performing physical exam.   Darice JONELLE Brownie,  FNP

## 2023-11-14 ENCOUNTER — Encounter: Payer: Self-pay | Admitting: Family Medicine

## 2023-11-14 LAB — URINALYSIS, ROUTINE W REFLEX MICROSCOPIC
Bilirubin, UA: NEGATIVE
Glucose, UA: NEGATIVE
Leukocytes,UA: NEGATIVE
Nitrite, UA: NEGATIVE
RBC, UA: NEGATIVE
Specific Gravity, UA: 1.028 (ref 1.005–1.030)
Urobilinogen, Ur: 0.2 mg/dL (ref 0.2–1.0)
pH, UA: 7 (ref 5.0–7.5)

## 2023-11-14 LAB — TSH+T4F+T3FREE
Free T4: 1.14 ng/dL (ref 0.82–1.77)
T3, Free: 3.1 pg/mL (ref 2.0–4.4)
TSH: 1.28 u[IU]/mL (ref 0.450–4.500)

## 2023-11-14 LAB — COMPREHENSIVE METABOLIC PANEL
ALT: 10 [IU]/L (ref 0–32)
AST: 15 [IU]/L (ref 0–40)
Albumin: 4.5 g/dL (ref 4.0–5.0)
Alkaline Phosphatase: 75 [IU]/L (ref 44–121)
BUN/Creatinine Ratio: 22 (ref 9–23)
BUN: 15 mg/dL (ref 6–20)
Bilirubin Total: 0.4 mg/dL (ref 0.0–1.2)
CO2: 17 mmol/L — ABNORMAL LOW (ref 20–29)
Calcium: 9.7 mg/dL (ref 8.7–10.2)
Chloride: 104 mmol/L (ref 96–106)
Creatinine, Ser: 0.68 mg/dL (ref 0.57–1.00)
Globulin, Total: 3 g/dL (ref 1.5–4.5)
Glucose: 88 mg/dL (ref 70–99)
Potassium: 4.5 mmol/L (ref 3.5–5.2)
Sodium: 140 mmol/L (ref 134–144)
Total Protein: 7.5 g/dL (ref 6.0–8.5)
eGFR: 122 mL/min/{1.73_m2} (ref >59)

## 2023-11-14 LAB — PT AND PTT
INR: 1 (ref 0.9–1.2)
Prothrombin Time: 11.5 s (ref 9.1–12.0)
aPTT: 30 s (ref 24–33)

## 2023-11-14 LAB — CBC WITH DIFFERENTIAL/PLATELET
Basophils Absolute: 0 10*3/uL (ref 0.0–0.2)
Basos: 0 %
EOS (ABSOLUTE): 0 10*3/uL (ref 0.0–0.4)
Eos: 0 %
Hematocrit: 39.7 % (ref 34.0–46.6)
Hemoglobin: 13.2 g/dL (ref 11.1–15.9)
Immature Grans (Abs): 0 10*3/uL (ref 0.0–0.1)
Immature Granulocytes: 0 %
Lymphocytes Absolute: 1.4 10*3/uL (ref 0.7–3.1)
Lymphs: 26 %
MCH: 31.7 pg (ref 26.6–33.0)
MCHC: 33.2 g/dL (ref 31.5–35.7)
MCV: 95 fL (ref 79–97)
Monocytes Absolute: 0.4 10*3/uL (ref 0.1–0.9)
Monocytes: 8 %
Neutrophils Absolute: 3.3 10*3/uL (ref 1.4–7.0)
Neutrophils: 66 %
Platelets: 393 10*3/uL (ref 150–450)
RBC: 4.16 x10E6/uL (ref 3.77–5.28)
RDW: 12.5 % (ref 11.7–15.4)
WBC: 5.2 10*3/uL (ref 3.4–10.8)

## 2023-11-14 LAB — HCG, SERUM, QUALITATIVE: hCG,Beta Subunit,Qual,Serum: NEGATIVE m[IU]/mL (ref <6)

## 2023-11-14 LAB — SICKLE CELL SCREEN: Sickle Cell Screen: NEGATIVE

## 2023-11-14 LAB — HGB A1C W/O EAG: Hgb A1c MFr Bld: 5.2 % (ref 4.8–5.6)

## 2023-11-14 LAB — HIV ANTIBODY (ROUTINE TESTING W REFLEX): HIV Screen 4th Generation wRfx: NONREACTIVE

## 2023-11-17 ENCOUNTER — Telehealth: Payer: Self-pay | Admitting: Family Medicine

## 2023-11-17 ENCOUNTER — Encounter: Payer: Self-pay | Admitting: Family Medicine

## 2023-11-17 NOTE — Telephone Encounter (Signed)
 Copied from CRM [id number]. Topic: General - Call Back - No Documentation >> Nov 14, 2023  1:37 PM [employer] wrote: Reason for CRM: Patient is requesting callback due to her surgeon stating that she needs to be prescribed an antibiotic prior to her surgery on 12/05/23. She is confused as to why she needs an antibiotic if her bloodwork came back normal. She is requesting a call from Dr. Le Primes, Mariah Shines. Her callback number is [phone]

## 2023-11-17 NOTE — Telephone Encounter (Signed)
 Message sent to patient for response.

## 2023-11-18 ENCOUNTER — Ambulatory Visit: Payer: Federal, State, Local not specified - PPO | Admitting: Family Medicine

## 2023-12-12 ENCOUNTER — Ambulatory Visit (INDEPENDENT_AMBULATORY_CARE_PROVIDER_SITE_OTHER): Admitting: Family Medicine

## 2023-12-12 ENCOUNTER — Encounter: Payer: Self-pay | Admitting: Family Medicine

## 2023-12-12 VITALS — BP 134/84 | HR 120 | Temp 99.6°F | Ht 63.0 in | Wt 198.0 lb

## 2023-12-12 DIAGNOSIS — Z9889 Other specified postprocedural states: Secondary | ICD-10-CM | POA: Diagnosis not present

## 2023-12-12 DIAGNOSIS — B3731 Acute candidiasis of vulva and vagina: Secondary | ICD-10-CM

## 2023-12-12 MED ORDER — FLUCONAZOLE 150 MG PO TABS
ORAL_TABLET | ORAL | 0 refills | Status: DC
Start: 2023-12-12 — End: 2024-06-16

## 2023-12-12 NOTE — Progress Notes (Signed)
 Established Patient Office Visit  Subjective   Patient ID: Danielle Arroyo, female    DOB: October 11, 1995  Age: 28 y.o. MRN: [id number]  Chief Complaint  Patient presents with   Vaginitis    Recent abx use   Work Notes    Patient needs a return to work note and a note for light duty from recent BBL surgery.    HPI  Recent surgery on 12/05/23 for BBL. Unable to sit down due to pain in the buttocks. On oxycodone for pain.  On Ciprofloxacin currently, reports vaginal itching and white discharge. Likely yeast infection. On Lovenox 40 mg for DVT prevention, today is the last day. Wants staff to give her this injection today. Lovenox is in a seal package from the manufacturer.   Review of Systems  Constitutional:  Negative for chills and fever.      Objective:     BP 134/84 (BP Location: Left Arm, Patient Position: Sitting, Cuff Size: Normal)   Pulse (!) 120   Temp 99.6 F (37.6 C) (Oral)   Ht 5\' 3"  (1.6 m)   Wt 198 lb (89.8 kg)   LMP 12/10/2023 (Exact Date)   SpO2 100%   BMI 35.07 kg/m  BP Readings from Last 3 Encounters:  12/12/23 134/84  11/12/23 119/74  09/18/23 126/82      Physical Exam Vitals and nursing note reviewed.  Constitutional:      General: She is not in acute distress.    Appearance: Normal appearance. She is not ill-appearing.  Cardiovascular:     Rate and Rhythm: Tachycardia present.  Pulmonary:     Effort: Pulmonary effort is normal.     Breath sounds: Normal breath sounds.  Skin:    General: Skin is warm and dry.  Neurological:     General: No focal deficit present.     Mental Status: She is alert. Mental status is at baseline.  Psychiatric:        Mood and Affect: Mood normal.        Behavior: Behavior normal.        Thought Content: Thought content normal.        Judgment: Judgment normal.     No results found for any visits on 12/12/23.    The ASCVD Risk score (Arnett DK, et al., 2019) failed to calculate for the following  reasons:   The 2019 ASCVD risk score is only valid for ages 2 to 72    Assessment & Plan:   Problem List Items Addressed This Visit     Vaginal yeast infection - Primary   Currently on antibiotic therapy per surgeon. Reports vaginal itching and white discharge. Diflucan 150 mg today, may repeat in 3 days if symptoms persist. Follow-up if symptoms do not resolve.       Relevant Medications   fluconazole (DIFLUCAN) 150 MG tablet   Status post cosmetic plastic surgery   S/P cosmetic surgery in [place]. She is recovering slowly. Unable to sit on her buttocks currently. Instructed to stay hydrated. Denies fever or chills. Has abdominal binder on today. Today is her last dose of Lovenox 40 mg SQ.  Requested this be given today at this visit. Lovenox in sealed package from manufacturer. Wound care per post op instructions per surgeon.  Needs return to work note with restrictions. Provided today. Follow-up as needed.      Agrees with plan of care discussed.  Questions answered.  Total time face to face: 29 minutes answering questions concerning  recovery and discussing work restrictions.   Return if symptoms worsen or fail to improve.    Novella Olive, FNP

## 2023-12-12 NOTE — Assessment & Plan Note (Addendum)
 Currently on antibiotic therapy per surgeon. Reports vaginal itching and white discharge. Diflucan 150 mg today, may repeat in 3 days if symptoms persist. Follow-up if symptoms do not resolve.

## 2023-12-12 NOTE — Assessment & Plan Note (Signed)
 S/P cosmetic surgery in [place]. She is recovering slowly. Unable to sit on her buttocks currently. Instructed to stay hydrated. Denies fever or chills. Has abdominal binder on today. Today is her last dose of Lovenox 40 mg SQ.  Requested this be given today at this visit. Lovenox in sealed package from manufacturer. Wound care per post op instructions per surgeon.  Needs return to work note with restrictions. Provided today. Follow-up as needed.

## 2023-12-14 ENCOUNTER — Other Ambulatory Visit: Payer: Self-pay | Admitting: Family Medicine

## 2023-12-15 ENCOUNTER — Other Ambulatory Visit (HOSPITAL_BASED_OUTPATIENT_CLINIC_OR_DEPARTMENT_OTHER): Payer: Self-pay

## 2023-12-15 MED ORDER — OXYCODONE-ACETAMINOPHEN 5-325 MG PO TABS
1.0000 | ORAL_TABLET | Freq: Three times a day (TID) | ORAL | 0 refills | Status: DC | PRN
  Filled 2023-12-15: qty 15, 5d supply, fill #0

## 2023-12-22 ENCOUNTER — Encounter: Payer: Self-pay | Admitting: Family Medicine

## 2023-12-22 ENCOUNTER — Ambulatory Visit (INDEPENDENT_AMBULATORY_CARE_PROVIDER_SITE_OTHER): Admitting: Family Medicine

## 2023-12-22 VITALS — BP 130/93 | HR 97 | Temp 99.2°F | Resp 18 | Ht 63.0 in | Wt 194.1 lb

## 2023-12-22 DIAGNOSIS — Z0289 Encounter for other administrative examinations: Secondary | ICD-10-CM | POA: Diagnosis not present

## 2023-12-22 DIAGNOSIS — Z9889 Other specified postprocedural states: Secondary | ICD-10-CM

## 2023-12-22 NOTE — Progress Notes (Signed)
   Established Patient Office Visit  Subjective   Patient ID: Danielle Arroyo, female    DOB: 04/09/1996  Age: 28 y.o. MRN: [id number]  Chief Complaint  Patient presents with   Paperwork    Patient is here for paper work to return back to work    HPI  Status post cosmetic surgery, works for [employer].  Needs accommodations paperwork completed today to return to work with restrictions.  Work duties for 2 hours at one time during the 8 hour shift.    ROS    Objective:     BP (!) 130/93   Pulse 97   Temp 99.2 F (37.3 C) (Oral)   Resp 18   Ht 5\' 3"  (1.6 m)   Wt 194 lb 1.6 oz (88 kg)   LMP 12/10/2023 (Exact Date)   SpO2 100%   BMI 34.38 kg/m  BP Readings from Last 3 Encounters:  12/22/23 (!) 130/93  12/12/23 134/84  11/12/23 119/74      Physical Exam Vitals and nursing note reviewed.  Constitutional:      General: She is not in acute distress.    Appearance: Normal appearance.  Cardiovascular:     Rate and Rhythm: Normal rate.  Pulmonary:     Effort: Pulmonary effort is normal.  Skin:    General: Skin is warm and dry.  Neurological:     General: No focal deficit present.     Mental Status: She is alert. Mental status is at baseline.  Psychiatric:        Mood and Affect: Mood normal.        Behavior: Behavior normal.        Thought Content: Thought content normal.        Judgment: Judgment normal.     No results found for any visits on 12/22/23.    The ASCVD Risk score (Arnett DK, et al., 2019) failed to calculate for the following reasons:   The 2019 ASCVD risk score is only valid for ages 71 to 62    Assessment & Plan:   Problem List Items Addressed This Visit     Encounter for completion of form with patient   Works for [employer], needs accommodations to complete daily work task.  Paperwork completed with assistance of Jannae. Will return with FMLA paperwork in one week.       Status post cosmetic plastic surgery - Primary  Agrees with plan of care  discussed.  Questions answered.   Return in about 1 week (around 12/29/2023) for FMLA paperwork .    Novella Olive, FNP

## 2023-12-22 NOTE — Assessment & Plan Note (Signed)
 Works for [employer], needs accommodations to complete daily work task.  Paperwork completed with assistance of Doreen. Will return with FMLA paperwork in one week.

## 2023-12-29 ENCOUNTER — Ambulatory Visit: Admitting: Family Medicine

## 2024-01-05 ENCOUNTER — Encounter: Payer: Self-pay | Admitting: Family Medicine

## 2024-01-09 ENCOUNTER — Ambulatory Visit (INDEPENDENT_AMBULATORY_CARE_PROVIDER_SITE_OTHER): Admitting: Family Medicine

## 2024-01-09 ENCOUNTER — Encounter: Payer: Self-pay | Admitting: Family Medicine

## 2024-01-09 VITALS — BP 124/85 | HR 90 | Temp 98.7°F | Ht 63.0 in | Wt 195.0 lb

## 2024-01-09 DIAGNOSIS — Z9889 Other specified postprocedural states: Secondary | ICD-10-CM | POA: Diagnosis not present

## 2024-01-09 NOTE — Progress Notes (Signed)
   Established Patient Office Visit  Subjective   Patient ID: Danielle Arroyo, female    DOB: August 05, 1996  Age: 28 y.o. MRN: [id number]  Chief Complaint  Patient presents with   FMLA    HPI Presents today to complete FMLA paperwork related to recent cosmetic surgery.     ROS    Objective:     BP 124/85 (BP Location: Right Arm, Patient Position: Standing, Cuff Size: Large)   Pulse 90   Temp 98.7 F (37.1 C) (Oral)   Ht 5\' 3"  (1.6 m)   Wt 195 lb (88.5 kg)   LMP 01/09/2024 (Exact Date)   SpO2 100%   BMI 34.54 kg/m    Physical Exam Vitals and nursing note reviewed.  Constitutional:      General: She is not in acute distress.    Appearance: Normal appearance.  Pulmonary:     Effort: Pulmonary effort is normal.  Skin:    General: Skin is warm and dry.  Neurological:     General: No focal deficit present.     Mental Status: She is alert. Mental status is at baseline.  Psychiatric:        Mood and Affect: Mood normal.        Behavior: Behavior normal.        Thought Content: Thought content normal.        Judgment: Judgment normal.     No results found for any visits on 01/09/24.    The ASCVD Risk score (Arnett DK, et al., 2019) failed to calculate for the following reasons:   The 2019 ASCVD risk score is only valid for ages 64 to 67    Assessment & Plan:   Problem List Items Addressed This Visit     Status post cosmetic plastic surgery - Primary   FMLA paperwork completed.      Agrees with plan of care discussed.  Questions answered.  Ttotal time face to face completing FMLA paperwork: 10 minutes   Return if symptoms worsen or fail to improve.    Novella Olive, FNP

## 2024-01-09 NOTE — Assessment & Plan Note (Signed)
 FMLA paperwork completed.

## 2024-04-02 ENCOUNTER — Ambulatory Visit: Admitting: Family Medicine

## 2024-04-02 ENCOUNTER — Ambulatory Visit: Payer: Self-pay | Admitting: *Deleted

## 2024-04-02 ENCOUNTER — Encounter: Payer: Self-pay | Admitting: Family Medicine

## 2024-04-02 VITALS — BP 134/92 | HR 69 | Temp 98.4°F | Ht 63.0 in | Wt 183.0 lb

## 2024-04-02 DIAGNOSIS — H579 Unspecified disorder of eye and adnexa: Secondary | ICD-10-CM | POA: Diagnosis not present

## 2024-04-02 DIAGNOSIS — T7840XA Allergy, unspecified, initial encounter: Secondary | ICD-10-CM | POA: Diagnosis not present

## 2024-04-02 MED ORDER — AZELASTINE HCL 0.05 % OP SOLN: 1.0000 [drp] | Freq: Two times a day (BID) | OPHTHALMIC | 0 refills | Status: DC

## 2024-04-02 MED ORDER — EPINEPHRINE 0.3 MG/0.3ML IJ SOAJ: 0.3000 mg | INTRAMUSCULAR | 1 refills | Status: AC | PRN

## 2024-04-02 MED ORDER — CETIRIZINE HCL 10 MG PO TABS: 10.0000 mg | ORAL_TABLET | Freq: Every day | ORAL | 0 refills | Status: DC

## 2024-04-02 NOTE — Assessment & Plan Note (Signed)
 Symptoms started with right eye erythema, spread to left eye. Denies respiratory symptoms or angioedema. Took benadryl  with resolution in symptoms. Unsure trigger. Start cetirizine 10 mg daily. Azelastine 0.05% opthalmic drops in both eyes as needed. Epi pen as precaution. Urgent referral placed to allergy specialist for testing. Understands if epi pen is needed, she will need to go to ED right away. Instructed to get instructions on epi pen use before leaving pharmacy.

## 2024-04-02 NOTE — Progress Notes (Signed)
 Acute Office Visit  Subjective:     Patient ID: Danielle Arroyo, female    DOB: [birth year], 28 y.o.   MRN: [id number]  Chief Complaint  Patient presents with   Medical Management of Chronic Issues    Allergic reaction, pt states she broke out in hives today after petting a dog,pt states she  took benadryl  and it went away,     HPI Patient is in today for allergic reaction that started in the right eye with redness and small bumps over eye lid. Moved to left eye, worse on right. No respiratory symptoms. No lip swelling. Used clear eye drops which did not change symptoms. Took benadryl  and symptoms resolved by time she got here today. Dog in home: arrived today. Has been around her before, has made eyes itch. This has happened without dog exposure. No new soaps, detergents, foods.    ROS      Objective:    BP (!) 134/92   Pulse 69   Temp 98.4 F (36.9 C) (Oral)   Ht 5' 3 (1.6 m)   Wt 183 lb (83 kg)   SpO2 99%   BMI 32.42 kg/m    Physical Exam Vitals and nursing note reviewed.  Constitutional:      General: She is not in acute distress.    Appearance: Normal appearance.   Cardiovascular:     Rate and Rhythm: Normal rate and regular rhythm.  Pulmonary:     Effort: Pulmonary effort is normal.     Breath sounds: Normal breath sounds.   Skin:    General: Skin is warm and dry.   Neurological:     General: No focal deficit present.     Mental Status: She is alert. Mental status is at baseline.   Psychiatric:        Mood and Affect: Mood normal.        Behavior: Behavior normal.        Thought Content: Thought content normal.        Judgment: Judgment normal.     No results found for any visits on 04/02/24.      Assessment & Plan:   Problem List Items Addressed This Visit     Allergic reaction - Primary   Symptoms started with right eye erythema, spread to left eye. Denies respiratory symptoms or angioedema. Took benadryl  with resolution in  symptoms. Unsure trigger. Start cetirizine 10 mg daily. Azelastine 0.05% opthalmic drops in both eyes as needed. Epi pen as precaution. Urgent referral placed to allergy specialist for testing. Understands if epi pen is needed, she will need to go to ED right away. Instructed to get instructions on epi pen use before leaving pharmacy.         Relevant Medications   EPINEPHrine 0.3 mg/0.3 mL IJ SOAJ injection   cetirizine (ZYRTEC) 10 MG tablet   azelastine (OPTIVAR) 0.05 % ophthalmic solution   Other Relevant Orders   Ambulatory referral to Allergy   Pruritus of eye   Relevant Medications   azelastine (OPTIVAR) 0.05 % ophthalmic solution   Other Relevant Orders   Ambulatory referral to Allergy    Meds ordered this encounter  Medications   EPINEPHrine 0.3 mg/0.3 mL IJ SOAJ injection    Sig: Inject 0.3 mg into the muscle as needed for anaphylaxis.    Dispense:  1 each    Refill:  1    Supervising Provider:   COLETTE TORRENCE GRADE [[id number]]   cetirizine (ZYRTEC) 10 MG  tablet    Sig: Take 1 tablet (10 mg total) by mouth daily.    Dispense:  60 tablet    Refill:  0    Supervising Provider:   RUCKER, ALETHEA Y [[id number]]   azelastine (OPTIVAR) 0.05 % ophthalmic solution    Sig: Place 1 drop into both eyes 2 (two) times daily.    Dispense:  6 mL    Refill:  0    Supervising Provider:   COLETTE TORRENCE GRADE [[id number]]  Agrees with plan of care discussed.  Questions answered.   Return for CPE with labs with pap (40 minutes) .  Darice JONELLE Brownie, FNP

## 2024-04-02 NOTE — Telephone Encounter (Signed)
 Copied from CRM [id number]. Topic: Clinical - Red Word Triage >> Apr 02, 2024  8:09 AM Carlatta H wrote: [facility] that prompted transfer to Nurse Triage: Patient had a allergic reaction to something//eyelids are itchy, red, swollen and fluid discharge/// Reason for Disposition  Eyelid is red and painful (or tender to touch)  Answer Assessment - Initial Assessment Questions 1. EYE DISCHARGE: Is the discharge in one or both eyes? What color is it? How much is there? When did the discharge start?      I'm having swelling, itching, redness in right eye and now it's both eyes.   When I scratch my eyes because they are itching they are watering.   I took 2 Benadryls.    2. REDNESS OF SCLERA: Is the redness in one or both eyes? When did the redness start?      Redness, swelling, eyelids are swollen.   I let my Mom's dog in my bed and this happened but this has happened before my exposure to my Mon's dog.   2-3 months ago this happened this happened.   It wasn't so bad so I did not see the doctor. 3. EYELIDS: Are the eyelids red or swollen? If Yes, ask: How much?      Yes  swollen, itching. 4. VISION: Is there any difficulty seeing clearly?      No 5. PAIN: Is there any pain? If Yes, ask: How bad is it? (Scale 1-10; or mild, moderate, severe)    - MILD (1-3): doesn't interfere with normal activities     - MODERATE (4-7): interferes with normal activities or awakens from sleep    - SEVERE (8-10): excruciating pain, unable to do any normal activities       No burning   Just itching real bad. 6. CONTACT LENS: Do you wear contacts?     No 7. OTHER SYMPTOMS: Do you have any other symptoms? (e.g., fever, runny nose, cough)     No other URI symptoms. 8. PREGNANCY: Is there any chance you are pregnant? When was your last menstrual period?     Not asked  Protocols used: Eye - Pus or Discharge-A-AH FYI Only or Action Required?: FYI only for provider.  Patient was last seen in  primary care on 01/09/2024 by Booker Darice SAUNDERS, FNP. Called Nurse Triage reporting Eye Pain, itching real bad, red, eyelids swollen in both eyes. Symptoms began today. Interventions attempted: Nothing  She did take 2 Benadryl  tablets which has helped a little but not much. Symptoms are: gradually worsening.  Triage Disposition: See Physician Within 24 Hours  Patient/caregiver understands and will follow disposition?: Yes

## 2024-04-07 ENCOUNTER — Encounter: Admitting: Family Medicine

## 2024-04-07 ENCOUNTER — Ambulatory Visit: Admitting: Family Medicine

## 2024-04-12 ENCOUNTER — Encounter: Admitting: Family Medicine

## 2024-04-19 ENCOUNTER — Ambulatory Visit (INDEPENDENT_AMBULATORY_CARE_PROVIDER_SITE_OTHER): Admitting: Family Medicine

## 2024-04-19 ENCOUNTER — Encounter: Payer: Self-pay | Admitting: Family Medicine

## 2024-04-19 VITALS — BP 116/79 | HR 85 | Temp 99.1°F | Ht 63.0 in | Wt 184.0 lb

## 2024-04-19 DIAGNOSIS — Z0000 Encounter for general adult medical examination without abnormal findings: Secondary | ICD-10-CM

## 2024-04-19 DIAGNOSIS — Z13220 Encounter for screening for lipoid disorders: Secondary | ICD-10-CM | POA: Diagnosis not present

## 2024-04-19 DIAGNOSIS — Z124 Encounter for screening for malignant neoplasm of cervix: Secondary | ICD-10-CM | POA: Insufficient documentation

## 2024-04-19 DIAGNOSIS — Z1329 Encounter for screening for other suspected endocrine disorder: Secondary | ICD-10-CM | POA: Insufficient documentation

## 2024-04-19 DIAGNOSIS — Z136 Encounter for screening for cardiovascular disorders: Secondary | ICD-10-CM

## 2024-04-19 NOTE — Progress Notes (Signed)
 Complete physical exam  Patient: Danielle Arroyo   DOB: 1996/07/25   28 y.o. Female  MRN: [id number]  Subjective:    Chief Complaint  Patient presents with   Annual Exam    Danielle Arroyo is a 28 y.o. female who presents today for a complete physical exam. She reports consuming a general diet. Gym/ health club routine includes cardio. She generally feels well. She reports sleeping well. She does not have additional problems to discuss today.    Most recent fall risk assessment:    12/09/2022    2:35 PM  Fall Risk   Falls in the past year? 0  Number falls in past yr: 0  Injury with Fall? 0  Risk for fall due to : No Fall Risks  Follow up Falls evaluation completed     Most recent depression screenings:    04/19/2024    9:56 AM 03/12/2023   12:00 PM  PHQ 2/9 Scores  PHQ - 2 Score 1 1  PHQ- 9 Score 4 3    Vision:Not within last year  and Dental: No current dental problems and No regular dental care     Patient Care Team: Booker Darice SAUNDERS, FNP as PCP - General (Family Medicine)   Outpatient Medications Prior to Visit  Medication Sig   EPINEPHrine  0.3 mg/0.3 mL IJ SOAJ injection Inject 0.3 mg into the muscle as needed for anaphylaxis.   azelastine  (OPTIVAR ) 0.05 % ophthalmic solution Place 1 drop into both eyes 2 (two) times daily. (Patient not taking: Reported on 04/19/2024)   cetirizine  (ZYRTEC ) 10 MG tablet Take 1 tablet (10 mg total) by mouth daily. (Patient not taking: Reported on 04/19/2024)   ciprofloxacin (CIPRO) 500 MG tablet Take 500 mg by mouth 2 (two) times daily. (Patient not taking: Reported on 04/19/2024)   enoxaparin (LOVENOX) 40 MG/0.4ML injection Inject 40 mg into the skin daily. (Patient not taking: Reported on 04/19/2024)   etonogestrel-ethinyl estradiol (NUVARING) 0.12-0.015 MG/24HR vaginal ring  (Patient not taking: Reported on 04/19/2024)   fluconazole  (DIFLUCAN ) 150 MG tablet Take on tablet by mouth today, if symptoms persist may repeat in 3 days. (Patient not  taking: Reported on 04/19/2024)   ibuprofen  (ADVIL ) 800 MG tablet Take 1 tablet (800 mg total) by mouth every 8 (eight) hours as needed. Take with food. (Patient not taking: Reported on 04/19/2024)   medroxyPROGESTERone (DEPO-PROVERA) 150 MG/ML injection Inject 150 mg into the muscle every 3 (three) months. (Patient not taking: Reported on 04/19/2024)   methocarbamol  (ROBAXIN ) 500 MG tablet Take 1 tablet (500 mg total) by mouth every 8 (eight) hours as needed for muscle spasms. (Patient not taking: Reported on 04/19/2024)   nitrofurantoin , macrocrystal-monohydrate, (MACROBID ) 100 MG capsule Take 1 capsule (100 mg total) by mouth 2 (two) times daily. (Patient not taking: Reported on 04/19/2024)   ondansetron  (ZOFRAN  ODT) 4 MG disintegrating tablet Take 1 tablet (4 mg total) by mouth every 8 (eight) hours as needed for nausea or vomiting. (Patient not taking: Reported on 04/19/2024)   oxyCODONE -acetaminophen  (PERCOCET/ROXICET) 5-325 MG tablet Take 1 tablet by mouth every 8 (eight) hours as needed. (Patient not taking: Reported on 04/19/2024)   valACYclovir  (VALTREX ) 500 MG tablet Take 1 tablet (500 mg total) by mouth 2 (two) times daily. (Patient not taking: Reported on 04/19/2024)   No facility-administered medications prior to visit.    ROS        Objective:     BP 116/79 (BP Location: Left Arm, Patient Position: Sitting, Cuff Size:  Normal)   Pulse 85   Temp 99.1 F (37.3 C) (Oral)   Ht 5' 3 (1.6 m)   Wt 184 lb (83.5 kg)   LMP 03/28/2024 (Exact Date)   SpO2 100%   BMI 32.59 kg/m    Physical Exam Vitals and nursing note reviewed. Exam conducted with a chaperone present.  Constitutional:      General: She is not in acute distress.    Appearance: Normal appearance.  HENT:     Right Ear: Tympanic membrane normal.     Left Ear: Tympanic membrane normal.     Nose: Nose normal.     Mouth/Throat:     Mouth: Mucous membranes are moist.     Pharynx: Oropharynx is clear.  Eyes:      Extraocular Movements: Extraocular movements intact.  Neck:     Thyroid: No thyroid tenderness.  Cardiovascular:     Rate and Rhythm: Normal rate and regular rhythm.     Pulses:          Radial pulses are 2+ on the right side and 2+ on the left side.     Heart sounds: Normal heart sounds, S1 normal and S2 normal.  Pulmonary:     Effort: Pulmonary effort is normal.     Breath sounds: Normal breath sounds.  Chest:  Breasts:    Right: Normal.     Left: Normal.  Abdominal:     General: Bowel sounds are normal.     Palpations: Abdomen is soft.     Tenderness: There is no abdominal tenderness.  Musculoskeletal:        General: Normal range of motion.     Cervical back: Normal range of motion.     Right lower leg: No edema.     Left lower leg: No edema.  Lymphadenopathy:     Cervical:     Right cervical: No superficial cervical adenopathy.    Left cervical: No superficial cervical adenopathy.  Skin:    General: Skin is warm and dry.  Neurological:     General: No focal deficit present.     Mental Status: She is alert. Mental status is at baseline.  Psychiatric:        Mood and Affect: Mood normal.        Behavior: Behavior normal.        Thought Content: Thought content normal.        Judgment: Judgment normal.      No results found for any visits on 04/19/24.     Assessment & Plan:    Routine Health Maintenance and Physical Exam   There is no immunization history on file for this patient.  Health Maintenance  Topic Date Due   DTaP/Tdap/Td (1 - Tdap) Never done   Hepatitis B Vaccines (1 of 3 - 19+ 3-dose series) Never done   HPV VACCINES (1 - 3-dose SCDM series) Never done   Cervical Cancer Screening (Pap smear)  01/27/2024   Hepatitis C Screening  Completed   HIV Screening  Completed   Meningococcal B Vaccine  Aged Out   INFLUENZA VACCINE  Discontinued   COVID-19 Vaccine  Discontinued    Discussed health benefits of physical activity, and encouraged her to  engage in regular exercise appropriate for her age and condition.  Annual physical exam  Encounter for lipid screening for cardiovascular disease -     Lipid panel  Screening for cervical cancer -     Pap LB (liquid-based)   +  Routine labs ordered. Lipid panel due today, others completed in February before surgery.  HCM reviewed/discussed. Pap smear today. Anticipatory guidance regarding healthy weight, lifestyle and choices given. Recommend healthy diet.  Recommend approximately 150 minutes/week of moderate intensity exercise. Resistance training is good for building muscles and for bone health. Muscle mass helps to increase our metabolism and to burn more calories at rest.  Limit alcohol consumption: no more than one drink per day for women and 2 drinks per day for me. Recommend regular dental and vision exams. Always use seatbelt/lap and shoulder restraints. Recommend using smoke alarms and checking batteries at least twice a year. Recommend using sunscreen when outside.  Please know that I am here to help you with all of your health care goals and am happy to work with you to find a solution that works best for you.  The greatest advice I have received with any changes in life are to take it one step at a time, that even means if all you can focus on is the next 60 seconds, then do that and celebrate your victories.  With any changes in life, you will have set backs, and that is OK. The important thing to remember is, if you have a set back, it is not a failure, it is an opportunity to try again! Agrees with plan of care discussed.  Questions answered.      Return in about 1 year (around 04/20/2025) for CPE with labs.     Darice JONELLE Brownie, FNP

## 2024-04-20 ENCOUNTER — Ambulatory Visit: Payer: Self-pay | Admitting: Family Medicine

## 2024-04-20 LAB — LIPID PANEL
Chol/HDL Ratio: 2.8 ratio (ref 0.0–4.4)
Cholesterol, Total: 148 mg/dL (ref 100–199)
HDL: 52 mg/dL
LDL Chol Calc (NIH): 83 mg/dL (ref 0–99)
Triglycerides: 65 mg/dL (ref 0–149)
VLDL Cholesterol Cal: 13 mg/dL (ref 5–40)

## 2024-04-21 LAB — PAP LB (LIQUID-BASED): PAP Smear Comment: 0

## 2024-05-11 ENCOUNTER — Ambulatory Visit: Payer: Self-pay | Admitting: Internal Medicine

## 2024-05-14 ENCOUNTER — Other Ambulatory Visit: Payer: Self-pay | Admitting: Family Medicine

## 2024-05-14 DIAGNOSIS — B009 Herpesviral infection, unspecified: Secondary | ICD-10-CM

## 2024-05-14 MED ORDER — VALACYCLOVIR HCL 500 MG PO TABS: 500.0000 mg | ORAL_TABLET | Freq: Two times a day (BID) | ORAL | 1 refills | Status: AC

## 2024-05-28 ENCOUNTER — Other Ambulatory Visit: Payer: Self-pay

## 2024-06-16 ENCOUNTER — Ambulatory Visit: Payer: Self-pay | Admitting: Internal Medicine

## 2024-06-16 ENCOUNTER — Encounter: Payer: Self-pay | Admitting: Internal Medicine

## 2024-06-16 ENCOUNTER — Other Ambulatory Visit: Payer: Self-pay | Admitting: Family Medicine

## 2024-06-16 VITALS — BP 112/78 | HR 69 | Resp 20 | Ht 63.75 in | Wt 187.7 lb

## 2024-06-16 DIAGNOSIS — J3089 Other allergic rhinitis: Secondary | ICD-10-CM | POA: Diagnosis not present

## 2024-06-16 DIAGNOSIS — L2089 Other atopic dermatitis: Secondary | ICD-10-CM

## 2024-06-16 DIAGNOSIS — L501 Idiopathic urticaria: Secondary | ICD-10-CM | POA: Diagnosis not present

## 2024-06-16 DIAGNOSIS — N3000 Acute cystitis without hematuria: Secondary | ICD-10-CM

## 2024-06-16 DIAGNOSIS — B9689 Other specified bacterial agents as the cause of diseases classified elsewhere: Secondary | ICD-10-CM

## 2024-06-16 MED ORDER — HYDROCORTISONE 2.5 % EX CREA: TOPICAL_CREAM | Freq: Two times a day (BID) | CUTANEOUS | 5 refills | Status: AC

## 2024-06-16 MED ORDER — TRIAMCINOLONE ACETONIDE 0.1 % EX OINT: TOPICAL_OINTMENT | CUTANEOUS | 1 refills | Status: AC

## 2024-06-16 NOTE — Patient Instructions (Addendum)
 Chronic urticaria Intermittent hives around mouth and eyes, likely allergic and auto-allergy pathways, possibly triggered by recent surgery. Discussed histamine release and immune triggers. Emphasized symptom-free status for management. - Order environmental allergy testing (1-55)   -avoid all antihistamines for 3 days prior  - Start Zyrtec  10mg  once daily, increase up to four times daily if needed to prevent hive and itch  - Order chronic hive labs   Seasonal allergic rhinitis with contact urticaria with dog exposure  Symptoms managed with over-the-counter Zyrtec , effective without breakthrough. - Continue Zyrtec  10mg  as needed during springtime.  Atopic dermatitis Eczema flares on neck, arms, and wrists. Current skincare routine exacerbates symptoms. Discussed gentle skincare and friction's role in exacerbation. - Discontinue Dial soap and Vanilla scented lotion. Daily Care For Maintenance (daily and continue even once eczema controlled) - Use hypoallergenic hydrating ointment at least twice daily.  This must be done daily for control of flares. (Great options include Vaseline, CeraVe, Aquaphor, Aveeno, Cetaphil, VaniCream, etc) - Avoid detergents, soaps or lotions with fragrances/dyes - Limit showers/baths to 5 minutes and use luke warm water instead of hot, pat dry following baths, and apply moisturizer - can use steroid/non-steroid therapy creams as detailed below up to twice weekly for prevention of flares.  For Flares:(add this to maintenance therapy if needed for flares) First apply steroid/non-steroid treatment creams. Wait 5 minutes then apply moisturizer. . - Triamcinolone  0.1% to body for moderate flares-apply topically twice daily to red, raised areas of skin, followed by moisturizer. Do NOT use on face, groin or armpits. - Hydrocortisone  2.5% to face/body-apply topically twice daily to red, raised areas of skin, followed by moisturizer    Follow up: for allergy testing (1-55)  October 3rd,

## 2024-06-16 NOTE — Progress Notes (Signed)
 NEW PATIENT Date of Service/Encounter:  06/16/24 Referring provider: Booker Darice SAUNDERS, FNP Primary care provider: Booker Darice SAUNDERS, FNP  Subjective:  Danielle Arroyo is a 28 y.o. female  presenting today for evaluation of enivornmental allergies  History obtained from: chart review and patient.   Discussed the use of AI scribe software for clinical note transcription with the patient, who gave verbal consent to proceed.  History of Present Illness Danielle Arroyo is a 28 year old female with eczema who presents with random hives on her face.  Facial urticaria - Random hives on the face since earlier this year, with increased frequency around June 2025 - Hives primarily appear around the mouth and eyes - Episodes often triggered by exposure to dogs, with one notable episode occurring within 15 minutes after a dog jumped on her bed, resulting in itching and periorbital swelling - Reactions to dogs have occurred previously, but the most recent episode was more severe - Lesions persist if scratched but resolve spontaneously if left alone - Occasional use of Benadryl  for symptom management when not working - No recent illnesses or infections prior to onset of hives - Prescribed an EpiPen  while awaiting consultation  Atopic dermatitis - History of eczema with random flares affecting the neck, arms, and wrists - Uses antibacterial Dial soap and Vanilla scented lotion for skincare  Allergic rhinitis - History of seasonal allergies, particularly in the spring - Uses over-the-counter Zyrtec  with effective symptom control and no breakthrough symptoms  Recent medication exposure - Underwent surgery in March 2025 and was prescribed several medications postoperatively, including oxycodone , ibuprofen  which she is no longer taking  Other allergy screening: Asthma: no Rhino conjunctivitis: yes Food allergy: no Medication allergy: no Hymenoptera allergy: no Urticaria: yes Eczema:yes History of  recurrent infections suggestive of immunodeficency: no Vaccinations are up to date.   Past Medical History: Past Medical History:  Diagnosis Date   Anxiety and depression    Medication List:  Current Outpatient Medications  Medication Sig Dispense Refill   EPINEPHrine  0.3 mg/0.3 mL IJ SOAJ injection Inject 0.3 mg into the muscle as needed for anaphylaxis. 1 each 1   hydrocortisone  2.5 % cream Apply topically 2 (two) times daily. 30 g 5   triamcinolone  ointment (KENALOG ) 0.1 % Apply topically twice daily to BODY as needed for red, sandpaper like rash.  Do not use on face, groin or armpits. 80 g 1   azelastine  (OPTIVAR ) 0.05 % ophthalmic solution Place 1 drop into both eyes 2 (two) times daily. (Patient not taking: Reported on 04/19/2024) 6 mL 0   cetirizine  (ZYRTEC ) 10 MG tablet Take 1 tablet (10 mg total) by mouth daily. (Patient not taking: Reported on 04/19/2024) 60 tablet 0   valACYclovir  (VALTREX ) 500 MG tablet Take 1 tablet (500 mg total) by mouth 2 (two) times daily. 30 tablet 1   No current facility-administered medications for this visit.   Known Allergies:  Allergies  Allergen Reactions   Flagyl  [Metronidazole ] Swelling   Past Surgical History: Past Surgical History:  Procedure Laterality Date   APPENDECTOMY  2011   Family History: Family History  Problem Relation Age of Onset   Allergic rhinitis Neg Hx    Angioedema Neg Hx    Asthma Neg Hx    Atopy Neg Hx    Eczema Neg Hx    Immunodeficiency Neg Hx    Urticaria Neg Hx    Social History: Danielle Arroyo lives single-family home is 28 years old.  Water damage in the  house.  Danielle Arroyo throughout.  Gas heating central cooling.  Dogs without access to bedroom.  No roaches in the house and bed is to be off floor.  Dust mite precautions on bed and pillows.  Active vapor in the house and car.  She works as a Agricultural consultant.  She does smoke cigars which she started in 2014.   ROS:  All other systems negative except as noted per  HPI.  Objective:  Blood pressure 112/78, pulse 69, resp. rate 20, height 5' 3.75 (1.619 m), weight 187 lb 11.2 oz (85.1 kg), SpO2 98%. Body mass index is 32.47 kg/m. Physical Exam:  General Appearance:  Alert, cooperative, no distress, appears stated age  Head:  Normocephalic, without obvious abnormality, atraumatic  Eyes:  Conjunctiva clear, EOM's intact  Ears EACs normal bilaterally and normal TMs bilaterally  Nose: Nares normal, erythematous nasal mucosa, no visible anterior polyps, and septum midline  Throat: Lips, tongue normal; teeth and gums normal, + cobblestoning  Neck: Supple, symmetrical  Lungs:   clear to auscultation bilaterally, Respirations unlabored, no coughing  Heart:  regular rate and rhythm and no murmur, Appears well perfused  Extremities: No edema  Skin: Skin color, texture, turgor normal and no rashes or lesions on visualized portions of skin  Neurologic: No gross deficits   Diagnostics: None done    Labs:  Lab Orders         Chronic Urticaria (CU) Eval         IgE         Tryptase       Assessment and Plan  Assessment and Plan Assessment & Plan Chronic urticaria,  Intermittent hives around mouth and eyes, likely allergic and auto-allergy pathways, possibly triggered by recent surgery. Discussed histamine release and immune triggers. Emphasized symptom-free status for management. - Order environmental allergy testing (1-55)   -avoid all antihistamines for 3 days prior  - Start Zyrtec  10mg  once daily, increase up to four times daily if needed to prevent hive and itch  - Order chronic hive labs   Seasonal allergic rhinitis, contact uticaria with dog exposure  Symptoms managed with over-the-counter Zyrtec , effective without breakthrough. - Continue Zyrtec  as needed during springtime.  Atopic dermatitis Eczema flares on neck, arms, and wrists. Current skincare routine exacerbates symptoms. Discussed gentle skincare and friction's role in  exacerbation. - Discontinue Dial soap and Vanilla scented lotion. Daily Care For Maintenance (daily and continue even once eczema controlled) - Use hypoallergenic hydrating ointment at least twice daily.  This must be done daily for control of flares. (Great options include Vaseline, CeraVe, Aquaphor, Aveeno, Cetaphil, VaniCream, etc) - Avoid detergents, soaps or lotions with fragrances/dyes - Limit showers/baths to 5 minutes and use luke warm water instead of hot, pat dry following baths, and apply moisturizer - can use steroid/non-steroid therapy creams as detailed below up to twice weekly for prevention of flares.  For Flares:(add this to maintenance therapy if needed for flares) First apply steroid/non-steroid treatment creams. Wait 5 minutes then apply moisturizer. . - Triamcinolone  0.1% to body for moderate flares-apply topically twice daily to red, raised areas of skin, followed by moisturizer. Do NOT use on face, groin or armpits. - Hydrocortisone  2.5% to face/body-apply topically twice daily to red, raised areas of skin, followed by moisturizer    Follow up: for allergy testing (1-55) October 3rd,   This note in its entirety was forwarded to the Provider who requested this consultation.  Other:    Thank you for your  kind referral. I appreciate the opportunity to take part in Danielle Arroyo's care. Please do not hesitate to contact me with questions.  Sincerely,  Thank you so much for letting me partake in your care today.  Don't hesitate to reach out if you have any additional concerns!  Hargis Springer, MD  [facility] [place], [place]

## 2024-06-23 DIAGNOSIS — L309 Dermatitis, unspecified: Secondary | ICD-10-CM | POA: Diagnosis not present

## 2024-07-09 ENCOUNTER — Ambulatory Visit: Admitting: Internal Medicine

## 2024-07-10 ENCOUNTER — Other Ambulatory Visit: Payer: Self-pay | Admitting: Family Medicine

## 2024-07-10 DIAGNOSIS — H579 Unspecified disorder of eye and adnexa: Secondary | ICD-10-CM

## 2024-07-10 DIAGNOSIS — T7840XA Allergy, unspecified, initial encounter: Secondary | ICD-10-CM

## 2024-07-30 ENCOUNTER — Ambulatory Visit: Admitting: Internal Medicine

## 2024-07-30 ENCOUNTER — Encounter: Payer: Self-pay | Admitting: Internal Medicine

## 2024-07-30 DIAGNOSIS — J3089 Other allergic rhinitis: Secondary | ICD-10-CM

## 2024-07-30 NOTE — Progress Notes (Signed)
 Date of Service/Encounter:  07/30/24  Allergy testing appointment   Initial visit on 06/16/24, seen for idiopathic urticaria, allergic rhinitis, atopic dermatitis.  Please see that note for additional details.  Today reports for allergy diagnostic testing:    DIAGNOSTICS:  Skin Testing: Environmental allergy panel. Adequate positive and negative controls. Results discussed with patient/family.  Airborne Adult Perc - 07/30/24 0918     Time Antigen Placed 0910    Allergen Manufacturer Jestine    Location Back    Number of Test 55    1. Control-Buffer 50% Glycerol Negative    2. Control-Histamine 3+    3. Bahia 3+    4. [place] 2+    5. Johnson 3+    6. [place]  Blue 4+    7. Meadow Fescue 4+    8. Perennial Rye 5+    9. Timothy 2+    10. Ragweed Mix Negative    11. Cocklebur Negative    12. Plantain,  English Negative    13. Baccharis Negative    14. Dog Fennel Negative    15. Russian Thistle Negative    16. Lamb's Quarters Negative    17. Sheep Sorrell Negative    18. Rough Pigweed Negative    19. Marsh Elder, Rough Negative    20. Mugwort, Common Negative    21. Box, Elder Negative    22. Cedar, red Negative    23. Sweet Gum 4+    24. Pecan Pollen 3+    25. Pine Mix Negative    26. Walnut, Black Pollen 3+    27. Red Mulberry Negative    28. Ash Mix 2+    29. Birch Mix 3+    30. Beech American 3+    31. Cottonwood, [place] 2+    32. Hickory, White 4+    33. Maple Mix 2+    34. Oak, [place] Mix 4+    35. Sycamore Eastern Negative    36. Alternaria Alternata 3+    37. Cladosporium Herbarum Negative    38. Aspergillus Mix Negative    39. Penicillium Mix Negative    40. Bipolaris Sorokiniana (Helminthosporium) Negative    41. Drechslera Spicifera (Curvularia) 2+    42. Mucor Plumbeus Negative    43. Fusarium Moniliforme 3+    44. Aureobasidium Pullulans (pullulara) 3+    45. Rhizopus Oryzae Negative    46. Botrytis Cinera Negative    47. Epicoccum Nigrum  Negative    48. Phoma Betae 2+    49. Dust Mite Mix 3+    50. Cat Hair 10,000 BAU/ml 3+    51.  Dog Epithelia Negative    52. Mixed Feathers Negative    53. Horse Epithelia Negative    54. Cockroach, German Negative    55. Tobacco Leaf Negative          Intradermal - 07/30/24 1014     Time Antigen Placed 1014    Allergen Manufacturer Jestine    Location Arm    Number of Test 6    Control Negative    Ragweed Mix 3+    Weed Mix 3+    Mold 2 3+    Dog 3+    Cockroach 4+          Allergy testing results were read and interpreted by myself, documented by clinical staff.  Patient provided with copy of allergy testing along with avoidance measures when indicated.   Rocky Endow, MD  [facility]  [place]   --------------------------------------------- Assessment and plan Chronic urticaria Intermittent hives around mouth and eyes, likely allergic and auto-allergy pathways, possibly triggered by recent surgery. Discussed histamine release and immune triggers. Emphasized symptom-free status for management. - Start Zyrtec  10mg  once daily, increase up to four times daily if needed to prevent hive and itch  - Order chronic hive labs   Seasonal allergic rhinitis with contact urticaria with dog exposure  Symptoms managed with over-the-counter Zyrtec , effective without breakthrough. - Continue Zyrtec  10mg  as needed during springtime. - environmental skin testing 07/26/2024 positive to grass pollen, tree pollen, molds, dust mite and cat, intradermal testing positive to weed pollen, indoor molds, dog and cockroach.  Allergen avoidance. -Consider allergy injections to reduce lifetime symptoms and need for medications by teaching your immune system to become tolerant of the environmental allergens you are allergic to  Atopic dermatitis Eczema flares on neck, arms, and wrists. Current skincare routine exacerbates symptoms. Discussed gentle skincare and friction's role  in exacerbation. - Discontinue Dial soap and Vanilla scented lotion. Daily Care For Maintenance (daily and continue even once eczema controlled) - Use hypoallergenic hydrating ointment at least twice daily.  This must be done daily for control of flares. (Great options include Vaseline, CeraVe, Aquaphor, Aveeno, Cetaphil, VaniCream, etc) - Avoid detergents, soaps or lotions with fragrances/dyes - Limit showers/baths to 5 minutes and use luke warm water instead of hot, pat dry following baths, and apply moisturizer - can use steroid/non-steroid therapy creams as detailed below up to twice weekly for prevention of flares.  For Flares:(add this to maintenance therapy if needed for flares) First apply steroid/non-steroid treatment creams. Wait 5 minutes then apply moisturizer. . - Triamcinolone  0.1% to body for moderate flares-apply topically twice daily to red, raised areas of skin, followed by moisturizer. Do NOT use on face, groin or armpits. - Hydrocortisone  2.5% to face/body-apply topically twice daily to red, raised areas of skin, followed by moisturizer    Follow up: 3 months sooner if needed.  Call us  back when you have spoken with insurance regarding allergy injections. It was a pleasure seeing you again in clinic today. Thank You for allowing me to participate in your care. Rocky Endow, MD

## 2024-07-30 NOTE — Patient Instructions (Addendum)
 Chronic urticaria Intermittent hives around mouth and eyes, likely allergic and auto-allergy pathways, possibly triggered by recent surgery. Discussed histamine release and immune triggers. Emphasized symptom-free status for management. - Start Zyrtec  10mg  once daily, increase up to four times daily if needed to prevent hive and itch  - Order chronic hive labs   Seasonal allergic rhinitis with contact urticaria with dog exposure  Symptoms managed with over-the-counter Zyrtec , effective without breakthrough. - Continue Zyrtec  10mg  as needed during springtime. - environmental skin testing 07/26/2024 positive to grass pollen, tree pollen, molds, dust mite and cat, intradermal testing positive to weed pollen, indoor molds, dog and cockroach.  Allergen avoidance. -Consider allergy injections to reduce lifetime symptoms and need for medications by teaching your immune system to become tolerant of the environmental allergens you are allergic to  Atopic dermatitis Eczema flares on neck, arms, and wrists. Current skincare routine exacerbates symptoms. Discussed gentle skincare and friction's role in exacerbation. - Discontinue Dial soap and Vanilla scented lotion. Daily Care For Maintenance (daily and continue even once eczema controlled) - Use hypoallergenic hydrating ointment at least twice daily.  This must be done daily for control of flares. (Great options include Vaseline, CeraVe, Aquaphor, Aveeno, Cetaphil, VaniCream, etc) - Avoid detergents, soaps or lotions with fragrances/dyes - Limit showers/baths to 5 minutes and use luke warm water instead of hot, pat dry following baths, and apply moisturizer - can use steroid/non-steroid therapy creams as detailed below up to twice weekly for prevention of flares.  For Flares:(add this to maintenance therapy if needed for flares) First apply steroid/non-steroid treatment creams. Wait 5 minutes then apply moisturizer. . - Triamcinolone  0.1% to body for  moderate flares-apply topically twice daily to red, raised areas of skin, followed by moisturizer. Do NOT use on face, groin or armpits. - Hydrocortisone  2.5% to face/body-apply topically twice daily to red, raised areas of skin, followed by moisturizer    Follow up: 3 months sooner if needed.  Call us  back when you have spoken with insurance regarding allergy injections. It was a pleasure seeing you again in clinic today. Thank You for allowing me to participate in your care. Rocky Endow, MD [facility]

## 2024-08-01 LAB — CHRONIC URTICARIA (CU) EVAL
ALT: 10 IU/L (ref 0–32)
Basophils Absolute: 0 x10E3/uL (ref 0.0–0.2)
Basos: 1 %
CRP: 4 mg/L (ref 0–10)
EOS (ABSOLUTE): 0.1 x10E3/uL (ref 0.0–0.4)
Eos: 2 %
Hematocrit: 40.1 % (ref 34.0–46.6)
Hemoglobin: 12.8 g/dL (ref 11.1–15.9)
Immature Grans (Abs): 0 x10E3/uL (ref 0.0–0.1)
Immature Granulocytes: 0 %
Lymphocytes Absolute: 1.5 x10E3/uL (ref 0.7–3.1)
Lymphs: 36 %
MCH: 30.8 pg (ref 26.6–33.0)
MCHC: 31.9 g/dL (ref 31.5–35.7)
MCV: 97 fL (ref 79–97)
Monocytes Absolute: 0.3 x10E3/uL (ref 0.1–0.9)
Monocytes: 8 %
Neutrophils Absolute: 2.3 x10E3/uL (ref 1.4–7.0)
Neutrophils: 53 %
Platelets: 432 x10E3/uL (ref 150–450)
Pooled Donor- BAT CU: 4.64 % (ref 0.00–10.60)
RBC: 4.15 x10E6/uL (ref 3.77–5.28)
RDW: 13.3 % (ref 11.7–15.4)
Sed Rate: 28 mm/h (ref 0–32)
TSH: 1.11 u[IU]/mL (ref 0.450–4.500)
Thyroperoxidase Ab SerPl-aCnc: 46 [IU]/mL — ABNORMAL HIGH (ref 0–34)
WBC: 4.3 x10E3/uL (ref 3.4–10.8)

## 2024-08-01 LAB — TRYPTASE: Tryptase: 4 ug/L (ref 2.2–13.2)

## 2024-08-01 LAB — IGE: IgE (Immunoglobulin E), Serum: 505 [IU]/mL — ABNORMAL HIGH (ref 6–495)

## 2024-08-03 ENCOUNTER — Encounter: Payer: Self-pay | Admitting: Family Medicine

## 2024-08-03 ENCOUNTER — Ambulatory Visit: Admitting: Family Medicine

## 2024-08-03 VITALS — BP 109/70 | HR 74 | Temp 99.1°F | Ht 66.0 in | Wt 195.0 lb

## 2024-08-03 DIAGNOSIS — R7989 Other specified abnormal findings of blood chemistry: Secondary | ICD-10-CM | POA: Insufficient documentation

## 2024-08-03 DIAGNOSIS — Z1329 Encounter for screening for other suspected endocrine disorder: Secondary | ICD-10-CM | POA: Diagnosis not present

## 2024-08-03 DIAGNOSIS — N926 Irregular menstruation, unspecified: Secondary | ICD-10-CM | POA: Insufficient documentation

## 2024-08-03 NOTE — Assessment & Plan Note (Addendum)
 Saw allergist on 06/16/24: labs revealed: + Thyroperoxidase Ab SerPl-aCnc Discussed implications of this test. Has had normal thyroid function in the past. Denies symptoms of sluggishness, dry skin, constipation, and cold intolerance. Repeat thyroid function today. Thyroid ultrasound. Follow-up in 6 months to monitor thyroid function.

## 2024-08-03 NOTE — Assessment & Plan Note (Signed)
 Reports regular/irregular menstrual cycle. Has never been pregnant and does not try to avoid pregnancy. Will get transvaginal ultrasound for evaluation. Follow-up based on results.

## 2024-08-03 NOTE — Progress Notes (Signed)
   Established Patient Office Visit  Subjective   Patient ID: Danielle Arroyo, female    DOB: 1995-11-30  Age: 28 y.o. MRN: [id number]  Chief Complaint  Patient presents with   Follow-up    Follow up on test result for high Thyroperoxidase test    Positive thyroperoxidase level per allergist. No symptoms of hypothyroidism. Normal thyroid function in February, normal TSH with recent labs.   Concerned about trying to get pregnant in the future.  Will order transvaginal ultrasound.       ROS    Objective:     BP 109/70 (BP Location: Left Arm, Patient Position: Sitting;Bed low/side rails up, Cuff Size: Normal)   Pulse 74   Temp 99.1 F (37.3 C) (Oral)   Ht 5' 6 (1.676 m)   Wt 195 lb (88.5 kg)   LMP 08/02/2024   SpO2 98%   BMI 31.47 kg/m    Physical Exam Vitals and nursing note reviewed.  Constitutional:      General: She is not in acute distress.    Appearance: Normal appearance.  Cardiovascular:     Rate and Rhythm: Normal rate and regular rhythm.     Heart sounds: Normal heart sounds.  Pulmonary:     Effort: Pulmonary effort is normal.     Breath sounds: Normal breath sounds.  Skin:    General: Skin is warm and dry.  Neurological:     General: No focal deficit present.     Mental Status: She is alert. Mental status is at baseline.  Psychiatric:        Mood and Affect: Mood normal.        Behavior: Behavior normal.        Thought Content: Thought content normal.        Judgment: Judgment normal.      No results found for any visits on 08/03/24.    The ASCVD Risk score (Arnett DK, et al., 2019) failed to calculate for the following reasons:   The 2019 ASCVD risk score is only valid for ages 58 to 81    Assessment & Plan:   Problem List Items Addressed This Visit     Screening for thyroid disorder - Primary   Relevant Orders   TSH+T4F+T3Free   US  THYROID   Abnormal thyroid blood test   Saw allergist on 06/16/24: labs revealed: +  Thyroperoxidase Ab SerPl-aCnc Discussed implications of this test. Has had normal thyroid function in the past. Denies symptoms of sluggishness, dry skin, constipation, and cold intolerance. Repeat thyroid function today. Thyroid ultrasound. Follow-up in 6 months to monitor thyroid function.       Relevant Orders   TSH+T4F+T3Free   US  THYROID   Irregular menstrual cycle   Reports regular/irregular menstrual cycle. Has never been pregnant and does not try to avoid pregnancy. Will get transvaginal ultrasound for evaluation. Follow-up based on results.       Relevant Orders   US  Pelvic Complete With Transvaginal  Agrees with plan of care discussed.  Questions answered.   Return in about 6 months (around 02/01/2025) for thyroid function .    Darice JONELLE Brownie, FNP

## 2024-08-04 ENCOUNTER — Ambulatory Visit: Payer: Self-pay | Admitting: Family Medicine

## 2024-08-04 LAB — TSH+T4F+T3FREE
Free T4: 1.08 ng/dL (ref 0.82–1.77)
T3, Free: 3.3 pg/mL (ref 2.0–4.4)
TSH: 1.35 u[IU]/mL (ref 0.450–4.500)

## 2024-08-08 ENCOUNTER — Other Ambulatory Visit: Payer: Self-pay | Admitting: Family Medicine

## 2024-08-08 DIAGNOSIS — T7840XA Allergy, unspecified, initial encounter: Secondary | ICD-10-CM

## 2024-08-14 ENCOUNTER — Ambulatory Visit (HOSPITAL_BASED_OUTPATIENT_CLINIC_OR_DEPARTMENT_OTHER)

## 2024-08-24 ENCOUNTER — Telehealth (HOSPITAL_BASED_OUTPATIENT_CLINIC_OR_DEPARTMENT_OTHER): Payer: Self-pay

## 2024-09-22 ENCOUNTER — Ambulatory Visit: Admitting: Family Medicine

## 2024-09-22 ENCOUNTER — Encounter: Payer: Self-pay | Admitting: Family Medicine

## 2024-09-22 VITALS — BP 125/78 | HR 74 | Temp 98.3°F | Ht 66.0 in | Wt 194.0 lb

## 2024-09-22 DIAGNOSIS — N898 Other specified noninflammatory disorders of vagina: Secondary | ICD-10-CM

## 2024-09-22 DIAGNOSIS — R5383 Other fatigue: Secondary | ICD-10-CM | POA: Diagnosis not present

## 2024-09-22 DIAGNOSIS — H579 Unspecified disorder of eye and adnexa: Secondary | ICD-10-CM | POA: Diagnosis not present

## 2024-09-22 MED ORDER — AZELASTINE HCL 0.05 % OP SOLN: 1.0000 [drp] | Freq: Two times a day (BID) | OPHTHALMIC | 1 refills | Status: AC

## 2024-09-22 NOTE — Assessment & Plan Note (Signed)
 Vaginal discharge and odor. Watery discharge and white in color.  Unprotected intercourse 1.5 weeks ago.  No known exposures. Nu swab today. Treatment based on results.

## 2024-09-22 NOTE — Assessment & Plan Note (Signed)
 Needs refill on eye drops for allergies. Went to allergist. Allergic to many things.  Azelastine  0.05% sol one drop in both eyes BID.

## 2024-09-22 NOTE — Progress Notes (Signed)
 Established Patient Office Visit  Subjective   Patient ID: Zelie Asbill, female    DOB: 1996/09/25  Age: 28 y.o. MRN: [id number]  Chief Complaint  Patient presents with   Vaginal Discharge    Alongside odor - would like to swab - onset just a few days If possible she is running low on her eyedrops would like a refill    Vaginal discharge and odor. Watery discharge and white in color.  Unprotected intercourse 1.5 weeks ago.  No known exposures. Nu swab today. Treatment based on results.   Needs refill on eye drops for allergies. Went to allergist. Allergic to many things.  Azelastine  0.05% sol one drop in both eyes BID.  Fatigue and sleepiness: Wonders if this is related to low vitamin D  levels. Will check CBC, iron, and vitamin d  levels today. Treatment based on results.        ROS    Objective:     BP 125/78 (BP Location: Left Arm, Patient Position: Sitting, Cuff Size: Normal)   Pulse 74   Temp 98.3 F (36.8 C) (Oral)   Ht 5' 6 (1.676 m)   Wt 194 lb (88 kg)   SpO2 99%   BMI 31.31 kg/m    Physical Exam Vitals and nursing note reviewed.  Constitutional:      General: She is not in acute distress.    Appearance: Normal appearance.  Cardiovascular:     Rate and Rhythm: Normal rate.     Heart sounds: Normal heart sounds.  Pulmonary:     Effort: Pulmonary effort is normal.     Breath sounds: Normal breath sounds.  Skin:    General: Skin is warm and dry.  Neurological:     General: No focal deficit present.     Mental Status: She is alert. Mental status is at baseline.  Psychiatric:        Mood and Affect: Mood normal.        Behavior: Behavior normal.        Thought Content: Thought content normal.        Judgment: Judgment normal.      No results found for any visits on 09/22/24.    The ASCVD Risk score (Arnett DK, et al., 2019) failed to calculate for the following reasons:   The 2019 ASCVD risk score is only valid for ages 30 to 24    * - Cholesterol units were assumed    Assessment & Plan:   Problem List Items Addressed This Visit     Vaginal discharge - Primary   Vaginal discharge and odor. Watery discharge and white in color.  Unprotected intercourse 1.5 weeks ago.  No known exposures. Nu swab today. Treatment based on results.       Relevant Orders   NuSwab Vaginitis Plus (VG+)   Pruritus of eye   Needs refill on eye drops for allergies. Went to allergist. Allergic to many things.  Azelastine  0.05% sol one drop in both eyes BID.      Relevant Medications   azelastine  (OPTIVAR ) 0.05 % ophthalmic solution   Other Visit Diagnoses       Other fatigue       Relevant Orders   VITAMIN D  25 Hydroxy (Vit-D Deficiency, Fractures)   CBC   Iron, TIBC and Ferritin Panel     Agrees with plan of care discussed.  Questions answered.   Return if symptoms worsen or fail to improve.    Darice JONELLE Brownie, FNP

## 2024-09-23 ENCOUNTER — Ambulatory Visit: Payer: Self-pay | Admitting: Family Medicine

## 2024-09-23 DIAGNOSIS — E559 Vitamin D deficiency, unspecified: Secondary | ICD-10-CM | POA: Insufficient documentation

## 2024-09-23 LAB — CBC
Hematocrit: 41.3 % (ref 34.0–46.6)
Hemoglobin: 13.7 g/dL (ref 11.1–15.9)
MCH: 31.1 pg (ref 26.6–33.0)
MCHC: 33.2 g/dL (ref 31.5–35.7)
MCV: 94 fL (ref 79–97)
Platelets: 486 x10E3/uL — ABNORMAL HIGH (ref 150–450)
RBC: 4.41 x10E6/uL (ref 3.77–5.28)
RDW: 12.6 % (ref 11.7–15.4)
WBC: 6 x10E3/uL (ref 3.4–10.8)

## 2024-09-23 LAB — IRON,TIBC AND FERRITIN PANEL
Ferritin: 36 ng/mL (ref 15–150)
Iron Saturation: 44 % (ref 15–55)
Iron: 180 ug/dL — ABNORMAL HIGH (ref 27–159)
Total Iron Binding Capacity: 409 ug/dL (ref 250–450)
UIBC: 229 ug/dL (ref 131–425)

## 2024-09-23 LAB — VITAMIN D 25 HYDROXY (VIT D DEFICIENCY, FRACTURES): Vit D, 25-Hydroxy: 17.8 ng/mL — ABNORMAL LOW (ref 30.0–100.0)

## 2024-09-23 MED ORDER — VITAMIN D (ERGOCALCIFEROL) 1.25 MG (50000 UNIT) PO CAPS: 50000.0000 [IU] | ORAL_CAPSULE | ORAL | 0 refills | Status: AC

## 2024-09-25 ENCOUNTER — Other Ambulatory Visit: Payer: Self-pay | Admitting: Family Medicine

## 2024-09-25 DIAGNOSIS — B9689 Other specified bacterial agents as the cause of diseases classified elsewhere: Secondary | ICD-10-CM

## 2024-09-25 LAB — NUSWAB VAGINITIS PLUS (VG+)
Atopobium vaginae: HIGH {score} — AB
BVAB 2: HIGH {score} — AB
Candida albicans, NAA: NEGATIVE
Candida glabrata, NAA: NEGATIVE
Chlamydia trachomatis, NAA: NEGATIVE
Neisseria gonorrhoeae, NAA: NEGATIVE
Trich vag by NAA: NEGATIVE

## 2024-09-26 ENCOUNTER — Other Ambulatory Visit: Payer: Self-pay | Admitting: Family Medicine

## 2024-09-26 DIAGNOSIS — B9689 Other specified bacterial agents as the cause of diseases classified elsewhere: Secondary | ICD-10-CM

## 2024-09-26 MED ORDER — METRONIDAZOLE 0.75 % VA GEL: 1.0000 | Freq: Every day | VAGINAL | 0 refills | Status: AC

## 2024-10-05 ENCOUNTER — Other Ambulatory Visit: Payer: Self-pay | Admitting: Family Medicine

## 2024-10-05 DIAGNOSIS — B3731 Acute candidiasis of vulva and vagina: Secondary | ICD-10-CM

## 2024-10-05 MED ORDER — FLUCONAZOLE 150 MG PO TABS: ORAL_TABLET | ORAL | 0 refills | Status: AC

## 2024-10-18 ENCOUNTER — Encounter: Payer: Self-pay | Admitting: Internal Medicine

## 2024-10-21 NOTE — Telephone Encounter (Signed)
 Can we look into this?

## 2024-11-04 ENCOUNTER — Ambulatory Visit: Admitting: Internal Medicine

## 2024-11-04 DIAGNOSIS — J302 Other seasonal allergic rhinitis: Secondary | ICD-10-CM

## 2025-02-01 ENCOUNTER — Ambulatory Visit: Admitting: Family Medicine

## 2025-04-19 ENCOUNTER — Encounter: Admitting: Family Medicine
